# Patient Record
Sex: Male | Born: 1960
Health system: Southern US, Community
[De-identification: ages and names within clinical notes are randomized; demographics above are authoritative.]

## PROBLEM LIST (undated history)

## (undated) DIAGNOSIS — K5792 Diverticulitis of intestine, part unspecified, without perforation or abscess without bleeding: Secondary | ICD-10-CM

## (undated) DIAGNOSIS — I10 Essential (primary) hypertension: Secondary | ICD-10-CM

## (undated) DIAGNOSIS — Z860100 Personal history of colon polyps, unspecified: Secondary | ICD-10-CM

## (undated) DIAGNOSIS — K760 Fatty (change of) liver, not elsewhere classified: Secondary | ICD-10-CM

## (undated) DIAGNOSIS — T7840XA Allergy, unspecified, initial encounter: Secondary | ICD-10-CM

## (undated) DIAGNOSIS — G4730 Sleep apnea, unspecified: Secondary | ICD-10-CM

## (undated) DIAGNOSIS — E785 Hyperlipidemia, unspecified: Secondary | ICD-10-CM

## (undated) HISTORY — DX: Essential (primary) hypertension: I10

## (undated) HISTORY — DX: Hyperlipidemia, unspecified: E78.5

## (undated) HISTORY — DX: Allergy, unspecified, initial encounter: T78.40XA

## (undated) HISTORY — DX: Personal history of colonic polyps: Z86.010

## (undated) HISTORY — DX: Personal history of colon polyps, unspecified: Z86.0100

## (undated) HISTORY — DX: Sleep apnea, unspecified: G47.30

## (undated) HISTORY — DX: Diverticulitis of intestine, part unspecified, without perforation or abscess without bleeding: K57.92

## (undated) HISTORY — DX: Fatty (change of) liver, not elsewhere classified: K76.0

## (undated) HISTORY — PX: COLONOSCOPY: SHX174

---
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
## 1999-06-02 HISTORY — PX: CERVICAL DISCECTOMY: SHX98

## 2001-01-28 ENCOUNTER — Inpatient Hospital Stay (HOSPITAL_COMMUNITY): Admission: RE | Admit: 2001-01-28 | Discharge: 2001-01-28 | Payer: Self-pay | Admitting: Neurosurgery

## 2001-01-28 ENCOUNTER — Encounter: Payer: Self-pay | Admitting: Neurosurgery

## 2001-02-21 ENCOUNTER — Encounter: Admission: RE | Admit: 2001-02-21 | Discharge: 2001-02-21 | Payer: Self-pay | Admitting: Neurosurgery

## 2001-02-21 ENCOUNTER — Encounter: Payer: Self-pay | Admitting: Neurosurgery

## 2002-02-08 ENCOUNTER — Emergency Department (HOSPITAL_COMMUNITY): Admission: EM | Admit: 2002-02-08 | Discharge: 2002-02-08 | Payer: Self-pay | Admitting: *Deleted

## 2006-07-27 ENCOUNTER — Ambulatory Visit: Payer: Self-pay | Admitting: Internal Medicine

## 2006-08-13 ENCOUNTER — Ambulatory Visit: Payer: Self-pay | Admitting: Gastroenterology

## 2006-08-24 ENCOUNTER — Encounter: Payer: Self-pay | Admitting: Gastroenterology

## 2006-08-24 ENCOUNTER — Ambulatory Visit: Payer: Self-pay | Admitting: Gastroenterology

## 2006-08-24 LAB — HM COLONOSCOPY

## 2007-01-13 DIAGNOSIS — J302 Other seasonal allergic rhinitis: Secondary | ICD-10-CM | POA: Insufficient documentation

## 2007-01-13 DIAGNOSIS — Z860100 Personal history of colon polyps, unspecified: Secondary | ICD-10-CM | POA: Insufficient documentation

## 2007-01-13 DIAGNOSIS — K5730 Diverticulosis of large intestine without perforation or abscess without bleeding: Secondary | ICD-10-CM | POA: Insufficient documentation

## 2007-01-13 DIAGNOSIS — J301 Allergic rhinitis due to pollen: Secondary | ICD-10-CM | POA: Insufficient documentation

## 2007-03-02 ENCOUNTER — Ambulatory Visit: Payer: Self-pay | Admitting: Internal Medicine

## 2007-03-02 DIAGNOSIS — G479 Sleep disorder, unspecified: Secondary | ICD-10-CM | POA: Insufficient documentation

## 2007-03-03 LAB — CONVERTED CEMR LAB
Cholesterol: 230 mg/dL (ref 0–200)
Direct LDL: 165.7 mg/dL
Glucose, Bld: 108 mg/dL — ABNORMAL HIGH (ref 70–99)
HDL: 45.9 mg/dL (ref >39.0)
Total CHOL/HDL Ratio: 5
Triglycerides: 135 mg/dL (ref 0–149)
VLDL: 27 mg/dL (ref 0–40)

## 2007-03-09 ENCOUNTER — Ambulatory Visit: Payer: Self-pay | Admitting: Pulmonary Disease

## 2007-03-29 ENCOUNTER — Ambulatory Visit (HOSPITAL_BASED_OUTPATIENT_CLINIC_OR_DEPARTMENT_OTHER): Admission: RE | Admit: 2007-03-29 | Discharge: 2007-03-29 | Payer: Self-pay | Admitting: Pulmonary Disease

## 2007-03-29 ENCOUNTER — Encounter: Payer: Self-pay | Admitting: Pulmonary Disease

## 2007-04-12 ENCOUNTER — Ambulatory Visit: Payer: Self-pay | Admitting: Pulmonary Disease

## 2007-04-20 ENCOUNTER — Ambulatory Visit: Payer: Self-pay | Admitting: Pulmonary Disease

## 2007-05-20 ENCOUNTER — Ambulatory Visit: Payer: Self-pay | Admitting: Pulmonary Disease

## 2007-05-20 DIAGNOSIS — G4733 Obstructive sleep apnea (adult) (pediatric): Secondary | ICD-10-CM | POA: Insufficient documentation

## 2007-06-27 ENCOUNTER — Encounter: Payer: Self-pay | Admitting: Pulmonary Disease

## 2007-11-18 ENCOUNTER — Ambulatory Visit: Payer: Self-pay | Admitting: Pulmonary Disease

## 2007-11-22 ENCOUNTER — Encounter: Payer: Self-pay | Admitting: Internal Medicine

## 2007-11-25 ENCOUNTER — Encounter: Payer: Self-pay | Admitting: Internal Medicine

## 2010-07-01 NOTE — Assessment & Plan Note (Signed)
Summary: CPX/RBH   Vital Signs:  Patient Profile:   50 Years Old Male Height:     73.25 inches Weight:      280.13 pounds Temp:     98.6 degrees F oral Pulse rate:   72 / minute BP sitting:   140 / 88  (left arm) Cuff size:   large  Vitals Entered By: Wandra Mannan (March 02, 2007 8:15 AM)                 Chief Complaint:  cpx.  History of Present Illness: Feels tired otherwise okay has gained over 20# since last visit--eats the wrong things and eats late at night works 12 hours/day lately No exercise Doesn't eat breakfast, does eat lunch (brings or gets fast food) discussed snacks--he must be healthier    Current Allergies (reviewed today): No known allergies   Past Medical History:    Reviewed history from 01/13/2007 and no changes required:       Colonic polyps, hx of-adenomatous       Diverticulosis, colon       Allergic rhinitis--hay fever  Past Surgical History:    Reviewed history from 01/13/2007 and no changes required:       Appendectomy 1973       Pylonic stenosis,  infant       Cervical diskectomy 2001   Family History:    Father: Died at age 23, colon cancer    Mother: Alive    Siblings: One brother, 2 sisters-1 had  thyroid cancer    CAD on  Dad's side    HTN in [place] GF    DM on  Both sides      Social History:    Reviewed history from 01/13/2007 and no changes required:       Marital Status: Married       Children: 2 sons       Occupation: Haematologist. Solicitor, [employer], etc.       Former Smoker--quit 2/07       Alcohol use-rare    Review of Systems  The patient denies vision loss, decreased hearing, chest pain, syncope, dyspnea on exhertion, prolonged cough, abdominal pain, melena, hematochezia, muscle weakness, depression, abnormal bleeding, and enlarged lymph nodes.         Wears seat belt Not sleeping great--awakens after 2 hours often--discussed sleep hygiene No excessive daytime somnolence but does have  snoring and apnea and is tired Needs dental attention--needs some extractions Occ ankle edema Bowels are okay No urinary problems Occ mild sexual problems--??related to fatigue No sig arthritis has a couple of spots on skin he wants checked Seasonal allergy symptoms--allegra helps   Physical Exam  General:     alert and normal appearance.   Eyes:     pupils equal, pupils round, pupils reactive to light, and no optic disk abnormalities.   Ears:     R ear normal and L ear normal.   Mouth:     no lesions.   Neck:     supple, no masses, no thyromegaly, no carotid bruits, and no cervical lymphadenopathy.   Lungs:     normal respiratory effort and normal breath sounds.   Heart:     normal rate, regular rhythm, no murmur, and no gallop.   Abdomen:     soft, non-tender, no masses, no hepatomegaly, and no splenomegaly.   Msk:     no joint tenderness and no joint swelling.   Pulses:  normal in feet Extremities:     no edema Skin:     several seborrheic keratoses Axillary Nodes:     No palpable lymphadenopathy Psych:     normally interactive, good eye contact, not anxious appearing, and not depressed appearing.      Impression & Recommendations:  Problem # 1:  ROUTINE GENERAL MEDICAL EXAM, NON PEDIATRIC (ICD-V70.0) Assessment: Comment Only Discussed diet and fitness check sugar and lipid Orders: TLB-Glucose, QUANT ([id number])   Problem # 2:  SLEEP DISORDER (ICD-780.50) Assessment: New needs sleep eval Orders: Pulmonary Referral (Pulmonary)   Complete Medication List: 1)  Fexofenadine Hcl 180 Mg Tabs (Fexofenadine hcl) .Marland Kitchen.. 1 daily as needed for allergies  Other Orders: TLB-Lipid Panel ([id number]) Venipuncture ([id number])   Patient Instructions: 1)  Please schedule a follow-up appointment in 1 year. 2)  Referral Appointment Information 3)  Day/Date: 4)  Time: 5)  Place/MD: 6)  Address: 7)  Phone/Fax: 8)  Patient given appointment information.  Information/Orders faxed/mailed.     ] Prior Medications: Current Allergies (reviewed today): No known allergies

## 2010-10-14 NOTE — Procedures (Signed)
NAMEGUILLAUME, Anthony Bates              ACCOUNT NO.:  [id number]   MEDICAL RECORD NO.:  [id number]          PATIENT TYPE:  OUT   LOCATION:  SLEEP CENTER                 FACILITY[facility]  [facility]   PHYSICIAN:  Barbaraann Share, MD,FCCPDATE OF BIRTH:  May 24, 1961   DATE OF STUDY:  03/29/2007                            NOCTURNAL POLYSOMNOGRAM   REFERRING PHYSICIAN:  Barbaraann Share, MD,FCCP   INDICATION FOR STUDY:  Hypersomnia with sleep apnea   EPWORTH SLEEPINESS SCORE:  15   MEDICATIONS:   SLEEP ARCHITECTURE:  The patient had a total sleep time of 260 minutes  with no slow-wave sleep and significantly decreased REM.  Sleep onset  latency was normal and REM onset was normal, as well.  Sleep efficiency  was decreased at 72%.   RESPIRATORY DATA:  Patient was found to have 146 obstructive hypopneas,  45 obstructive apneas and 36 central apneas for an apnea/hypopnea index  of 52 events per hour.  The events occurred more frequently in the  supine position, as well as REM, and there was very loud snoring noted  throughout.   OXYGEN DATA:  There was O2 desaturation as low as 72% with the patient's  obstructive events.   CARDIAC DATA:  No clinically significant cardiac arrhythmias were noted.   MOVEMENT-PARASOMNIA:  None.   IMPRESSIONS-RECOMMENDATIONS:  Severe obstructive sleep apnea/hypopnea  syndrome with an apnea/hypopnea index of 52 events per hour and O2  desaturation as low as 72%.  Treatment for this degree of sleep apnea  should focus primarily on weight-loss, as well as CPAP.      Barbaraann Share, MD,FCCP  Diplomate, American Board of Sleep  Medicine  Electronically Signed     [facility]/MED[facility]  D:  04/12/2007 15:02:41  T:  04/13/2007 09:54:27  Job:  [id number]

## 2010-10-14 NOTE — Assessment & Plan Note (Signed)
[facility]                             PULMONARY OFFICE NOTE   RASHUN, GRATTAN                       MRN:          [id number]  DATE:04/20/2007                            DOB:          1960/10/28    REASON FOR VISIT:  Sleep medicine followup.   SUBJECTIVE:  Mr. Postema comes in today for followup of his recent  sleep study.  He was found to have severe obstructive sleep apnea with  an apnea/hypopnea index of 52 events per hour and oxygen desaturation as  low as 72%.  I have gone over his study in great detail with Mr.  Neisen and have answered all of his questions.   PHYSICAL EXAMINATION:  GENERAL APPEARANCE:  He is an overweight male in  no acute distress.  VITAL SIGNS:  Blood pressure is 144/100. Temperature is 97.7. Weight is  287 pounds.  Oxygen saturation is 95% on room air.   IMPRESSION:  Severe obstructive sleep apnea/hypopnea syndrome documented  by nocturnal polysomnography.  Given the severity of the patient's sleep  apnea, weight loss and CPAP are really his best options.  The patient is  agreeable to trying this.   PLAN:  1. Will initiate CPAP at 10 cm with a full face mask at this time.  He      will ultimately need pressure optimization.  2. I have asked the patient to try to get his blood pressure checked      over the next few days and if it continues to be elevated, he is to      contact Dr. Alphonsus Sias.  3. The patient will follow up in four weeks or sooner if there are      problems.     Barbaraann Share, MD,FCCP  Electronically Signed    [facility]Med[facility]  DD: 04/20/2007  DT: 04/21/2007  Job #: [id number]   cc:   Karie Schwalbe, MD

## 2010-10-14 NOTE — Assessment & Plan Note (Signed)
[facility]                             PULMONARY OFFICE NOTE   Anthony Bates, Anthony Bates                       MRN:          [id number]  DATE:03/09/2007                            DOB:          Oct 06, 1960    HISTORY OF PRESENT ILLNESS:  Patient is a 50 year old gentleman who I  have been asked to see for possible obstructive sleep apnea.  The  patient states that he has been having significant sleeping issues as  well as fatigue and sleep maintenance issues for quite some time.  Patient generally gets to bed between 10 and 12 at night and gets up at  6:30 a.m. to start his day.  He is very unrested when he arises.  He has  been noted to have loud snoring as well as pauses in his breathing  during sleep.  The patient typically takes 30 minutes to an hour to fall  asleep but when questioned very closely he admits that he possibly could  be having microsleep with frequent awakenings to the point that he  believes that he has never fallen asleep.  He also has at least 3  awakenings a night for unknown reasons.  Patient works as a Development worker, international aid and has significant sleep pressure during the day with periods  of inactivity especially in the afternoons, he will fall asleep very  easily with movies or TV in the evenings.  He does have some issues with  long distance driving unless it is after 9 p.m.  Of note, the patient's  weight is up about 50 pounds over the last 2 years.   PAST MEDICAL HISTORY:  1. Allergic rhinitis.  2. Chronic headaches.  3. Status post appendectomy.  4. History of spine surgery.   MEDICATIONS:  None.   PATIENT HAS NO KNOWN DRUG ALLERGIES.   SOCIAL HISTORY:  He is married and has children.  He has a history of  smoking 1 pack per day for 31 years, he has not smoked since 2007.   FAMILY HISTORY:  Remarkable for father having colon cancer, sister  having thyroid cancer.   REVIEW OF SYSTEMS:  As per history of present illness, also  see patient  intake form documented in the chart.   PHYSICAL EXAMINATION:  GENERAL:  He is an obese male in no acute  distress.  Blood pressure is 134/90, pulse 104, temperature is 98.4,  weight is 285 pounds, he is 6 foot 2 inches tall, O2 saturation on room  air is 95%.  HEENT:  Pupils equal, round, reactive to light and accommodation;  extraocular muscles are intact.  Nares show almost total obstruction of  his right nare and deviated septum to the left with narrowing,  oropharynx reveals elongation of the soft palata and uvula, tonsillar  hypertrophy.  NECK:  Supple without JVD or lymphadenopathy, there is no palpable  thyromegaly.  CHEST:  Totally clear.  CARDIAC:  Reveals regular rate and rhythm with no murmurs, rubs or  gallops.  ABDOMEN:  Soft, nontender with good bowel sounds.  Genital exam, rectal exam, breast exam was not  done and not indicated.  LOWER EXTREMITIES:  Without edema, good pulses distally, there is no  calf tenderness.  NEUROLOGICALLY:  Alert and oriented with no obvious motor deficits.   IMPRESSION:  Probable obstructive sleep apnea.  The patient has a very  good history for this, is overweight and has abnormal upper airway  anatomy.  I suspect most of his insomnia is secondary to sleep apnea  with microsleep.  Had a long conversation with him about sleep apnea and  have described for him the quality of life issues as well as the  cardiovascular side effects.  I think he needs a sleep study, and the  patient is agreeable to this.   PLAN:  1. Nocturnal polysomnogram.  2. Work on weight loss.  3. The patient will follow up after the above.     Barbaraann Share, MD,FCCP  Electronically Signed    [facility]Med[facility]  DD: 03/10/2007  DT: 03/11/2007  Job #: [id number]   cc:   Karie Schwalbe, MD

## 2010-10-17 NOTE — H&P (Signed)
[place]  Patient:    Anthony Bates, Anthony Bates Visit Number: [id number] MRN: [id number]          Service Type: SUR Location: [facility] 3172 07 Attending Physician:  Danella Penton Dictated by:   Tanya Nones. Jeral Fruit, M.D. Admit Date:  01/28/2001                           History and Physical  HISTORY OF PRESENT ILLNESS:  The gentleman is a patient who was seen by me about two weeks ago in my office because while he was at work on November 05, 2000, the patient felt a popping sensation in his neck.  From then off, he developed neck pain with radiation down to the left upper extremity and despite conservative treatment, he is not any better.  Basically, although he had been able to work, he was quite restricted.  He has some numbness of the left hand which is getting worse; it mostly involved the thumb and index finger.  He denies any problem with the right upper extremity.  PAST MEDICAL HISTORY:  Appendectomy in 1972.  SOCIAL HISTORY:  He smokes a pack a day.  He drinks socially.  FAMILY HISTORY:  Mother and sister followed with high blood pressure.  There is no history of diabetes in his family.  REVIEW OF SYSTEMS:  Negative except for neck pain.  PHYSICAL EXAMINATION:  HEENT:  Normal.  NECK:  Normal.  Although he is able to flex, extension produces pain that goes to the left shoulder.  LUNGS:  Clear.  HEART:  Heart sounds normal.  ABDOMEN:  Normal.  EXTREMITIES:  Normal pulses.  NEUROLOGIC:  Mental status normal.  Cranial nerves normal.  Strength is 5/5 except that I can break easily the left biceps and the left wrist extensor.  Sensation is normal to pinprick, light touch and vibration.  He complains of numbness which involves the thumb and index finger.  Reflexes are symmetrical with absence of the left biceps.  Coordination normal.  IMAGING STUDIES:  X-ray shows decrease of the height between 5-6.  MRI shows a herniated disk central  and to the left compromising the C6 nerve root.  CLINICAL IMPRESSION:  C5-6 herniated disk with a left C6 radiculopathy.  RECOMMENDATION:  The patient is being admitted for surgery.  He knows about the risks such as infection, CSF leak, worsening in the pain, damage to the vocal cords, damage to the carotids with a stroke as a result, failure of the bone graft and need for further surgery. Dictated by:   Tanya Nones. Jeral Fruit, M.D. Attending Physician:  Danella Penton DD:  01/28/01 TD:  01/28/01 Job: [id number]

## 2010-10-17 NOTE — Op Note (Signed)
[place]  Patient:    ERROL, ALA Visit Number: [id number] MRN: [id number]          Service Type: SUR Location: 3000 3041 01 Attending Physician:  Danella Penton Dictated by:   Tanya Nones. Jeral Fruit, M.D. Proc. Date: 01/28/01 Admit Date:  01/28/2001   CC:         Lubertha Basque. Jerl Santos, M.D.   Operative Report  PREOPERATIVE DIAGNOSIS:  C5-C6 herniated disk central and to the right.  POSTOPERATIVE DIAGNOSIS:  C5-C6 herniated disk central and to the right.  OPERATION:  Anterior C5-C6 diskectomy with decompression of the spinal cord and the nerve.  Interbody fusion without bone graft, plate, microscope.  SURGEON:  Tanya Nones. Jeral Fruit, M.D.  ASSISTANT:  Hewitt Shorts, M.D.  CLINICAL HISTORY:  The patient is a 50 year old gentleman complaining of neck and left lower extremity pain associated with weakness of the biceps.  X-rays show herniated disk at the level of 5-6 central and to the left.  Because of no improvement, the patient agreed with surgery.  The risks were explained in History & Physical.  DESCRIPTION OF PROCEDURE:  The patient was taken to the OR, and then the left side of the neck was prepped with Betadine.  Transverse incision through the skin and platysma was carried out all the way to the cervical spine.  X-ray showed that we were at the level of C5-6.  The anterior ligament was opened. We brought the microscope in, and we started doing a gross diskectomy.  We found that he has quite a bit of spondylosis.  We used the drill to remove the spondylosis centrally and laterally.  Then we opened the posterior ligament and, indeed, the there were six to seven pieces of fragment going to the left side.  Total removal of the ligament as well as total removal of free fragment was achieved.  In the left side, we found that the nerve root was swollen and reddish.  Decompression with bilateral foraminotomy was done.  Than an 8  mm bone graft was inserted between 5-6 followed by a plate.  Four screws were used.  At the end, we had good decompression, and the x-ray showed good positioning of the plate and the graft.  From then on, the area was investigated, and the carotid and ______ were normal.  Hemostasis was done by bipolar, and the wound was closed with Vicryl and Steri-Strips.  The patient did well. Dictated by:   Tanya Nones. Jeral Fruit, M.D. Attending Physician:  Danella Penton DD:  01/28/01 TD:  01/28/01 Job: [id number]

## 2010-11-08 ENCOUNTER — Encounter: Payer: Self-pay | Admitting: Internal Medicine

## 2010-11-10 ENCOUNTER — Ambulatory Visit (INDEPENDENT_AMBULATORY_CARE_PROVIDER_SITE_OTHER): Payer: BC Managed Care – PPO | Admitting: Internal Medicine

## 2010-11-10 ENCOUNTER — Encounter: Payer: Self-pay | Admitting: Internal Medicine

## 2010-11-10 DIAGNOSIS — L821 Other seborrheic keratosis: Secondary | ICD-10-CM | POA: Insufficient documentation

## 2010-11-10 NOTE — Assessment & Plan Note (Signed)
Clearly a seb keratosis but there is a distinct area on the side that could be an early cancer (though unlikely) Will plan to biopsy if it worsens

## 2010-11-10 NOTE — Patient Instructions (Signed)
Please call for appointment to do biopsy if that red area becomes more prominent

## 2010-11-10 NOTE — Progress Notes (Signed)
  Subjective:    Patient ID: Anthony Bates, male    DOB: Feb 23, 1961, 50 y.o.   MRN: [id number]  HPI Wife wanted him to get growth on right shoulder checked First noted about 3 weeks ago Was raised and looked a bit red No fever No pain  No known injury  Current Outpatient Prescriptions on File Prior to Visit  Medication Sig Dispense Refill  . DISCONTD: cetirizine (ZYRTEC) 10 MG tablet Take 10 mg by mouth daily as needed.        Marland Kitchen DISCONTD: naproxen sodium (ANAPROX) 220 MG tablet Take 220 mg by mouth daily as needed.         Past Medical History  Diagnosis Date  . Hx of colonic polyps      hx of-adenomatous  . Diverticulitis   . Allergy     Past Surgical History  Procedure Date  . Appendectomy   . Cervical discectomy 2001    Family History  Problem Relation Age of Onset  . Hypertension Paternal Grandfather     History   Social History  . Marital Status: Married    Spouse Name: N/A    Number of Children: 2  . Years of Education: N/A   Occupational History  . asst plant manager [employer]   Social History Main Topics  . Smoking status: Former Smoker    Quit date: 06/01/2005  . Smokeless tobacco: Not on file  . Alcohol Use: Yes     rare  . Drug Use: Not on file  . Sexually Active: Not on file   Other Topics Concern  . Not on file   Social History Narrative  . No narrative on file   Review of Systems No other rashes No fever     Objective:   Physical Exam  Constitutional: He appears well-developed and well-nourished. No distress.  Skin:       Seborrheic keratosis on right shoulder--along lateral border there is a 3mm area which appears somewhat distinct with firm border.           Assessment & Plan:

## 2011-02-12 ENCOUNTER — Encounter: Payer: Self-pay | Admitting: Internal Medicine

## 2011-02-12 ENCOUNTER — Ambulatory Visit (INDEPENDENT_AMBULATORY_CARE_PROVIDER_SITE_OTHER): Payer: BC Managed Care – PPO | Admitting: Internal Medicine

## 2011-02-12 DIAGNOSIS — Z23 Encounter for immunization: Secondary | ICD-10-CM

## 2011-02-12 DIAGNOSIS — Z0000 Encounter for general adult medical examination without abnormal findings: Secondary | ICD-10-CM | POA: Insufficient documentation

## 2011-02-12 DIAGNOSIS — G4733 Obstructive sleep apnea (adult) (pediatric): Secondary | ICD-10-CM

## 2011-02-12 DIAGNOSIS — R079 Chest pain, unspecified: Secondary | ICD-10-CM

## 2011-02-12 NOTE — Assessment & Plan Note (Signed)
Uses the CPAP with success

## 2011-02-12 NOTE — Assessment & Plan Note (Signed)
Healthy but out of shape BP mildly elevated---repeat on right 148/90 Discussed fitness Needs some relief at work Tdap given

## 2011-02-12 NOTE — Assessment & Plan Note (Signed)
Very nonspecific and brief EKG shows no ischemic changes ??criteria for LVH (not really in precordial leads). Discussed relationship with BP Will have him check periodically

## 2011-02-12 NOTE — Progress Notes (Signed)
Subjective:    Patient ID: Anthony Bates, male    DOB: [birth year], 50 y.o.   MRN: [id number]  HPI Here for physical No concerns Same job  Due for tetanus booster  He doesn't want flu  No regular exercise--counselled Works very long hours--12 hour days Hoping to give up some of the responsibility  Uses CPAP machine Sleeps well but often disturbed by phone (on call from work)  Doesn't check BP  No current outpatient prescriptions on file prior to visit.    No Known Allergies  Past Medical History  Diagnosis Date  . Hx of colonic polyps      hx of-adenomatous  . Diverticulitis   . Allergy     Past Surgical History  Procedure Date  . Appendectomy   . Cervical discectomy 2001    Family History  Problem Relation Age of Onset  . Hypertension Paternal Grandfather     History   Social History  . Marital Status: Married    Spouse Name: N/A    Number of Children: 2  . Years of Education: N/A   Occupational History  . asst plant manager [employer]   Social History Main Topics  . Smoking status: Former Smoker    Quit date: 06/01/2005  . Smokeless tobacco: Never Used  . Alcohol Use: Yes     rare  . Drug Use: Not on file  . Sexually Active: Not on file   Other Topics Concern  . Not on file   Social History Narrative  . No narrative on file   Review of Systems  Constitutional: Negative for fatigue and unexpected weight change.       Wears seat belt  HENT: Positive for congestion and rhinorrhea. Negative for hearing loss, dental problem and tinnitus.        Seasonal allergies--uses OTC  Had some teeth pulled earlier in the year  Eyes: Negative for visual disturbance.       No diplopia or unilateral vision loss  Respiratory: Positive for shortness of breath. Negative for cough and chest tightness.        DOE---seems to be stable   Cardiovascular: Positive for chest pain and leg swelling. Negative for palpitations.       Slight stabbing  sensation in chest---if moves the wrong way. Not with exertion. Lasts only 1-2 minutes Occ edema  Gastrointestinal: Negative for nausea, vomiting, abdominal pain, constipation and blood in stool.       Heartburn more regularly. TUMs help  Genitourinary: Negative for dysuria, urgency and difficulty urinating.       No sig sexual problems  Musculoskeletal: Positive for arthralgias. Negative for back pain and joint swelling.       Neck arthritis since discectomy. No meds or heat  Skin: Negative for rash.       No change in seb keratosis  Neurological: Positive for dizziness and headaches. Negative for syncope, weakness, light-headedness and numbness.       Slight orthostatic dizziness if he stands up too fast Stress headaches---evenings   Hematological: Negative for adenopathy. Does not bruise/bleed easily.  Psychiatric/Behavioral: Positive for dysphoric mood. Negative for sleep disturbance. The patient is not nervous/anxious.        Lots of stress occ depressed days but never more than 1 day       Objective:   Physical Exam  Constitutional: He is oriented to person, place, and time. He appears well-developed and well-nourished. No distress.  HENT:  Head: Normocephalic and atraumatic.  Right Ear: External ear normal.  Left Ear: External ear normal.  Mouth/Throat: Oropharynx is clear and moist. No oropharyngeal exudate.       TMs normal  Eyes: Conjunctivae and EOM are normal. Pupils are equal, round, and reactive to light.       Fundi benign  Neck: Normal range of motion. Neck supple. No JVD present. No tracheal deviation present. No thyromegaly present.  Cardiovascular: Normal rate, regular rhythm, normal heart sounds and intact distal pulses.  Exam reveals no gallop.   No murmur heard. Pulmonary/Chest: Effort normal and breath sounds normal. No respiratory distress. He has no wheezes. He has no rales.  Abdominal: Soft. He exhibits no mass. There is no tenderness.  Musculoskeletal:  Normal range of motion. He exhibits no edema and no tenderness.  Lymphadenopathy:    He has no cervical adenopathy.  Neurological: He is alert and oriented to person, place, and time. He exhibits normal muscle tone.       Normal strength and gait  Skin: No rash noted.       Scattered seb keratoses  Psychiatric: He has a normal mood and affect. His behavior is normal. Judgment and thought content normal.          Assessment & Plan:

## 2011-02-12 NOTE — Patient Instructions (Addendum)
If you need TUMS more than twice a week or so, please try ranitidine 150mg  once or twice a day, or famotidine 20mg  once or twice a day Please check your blood pressure every month or 2. Call for appt to reevaluate if over 145/90 regularly

## 2011-02-13 LAB — HEPATIC FUNCTION PANEL
ALT: 84 U/L — ABNORMAL HIGH (ref 0–53)
AST: 61 U/L — ABNORMAL HIGH (ref 0–37)
Albumin: 4.3 g/dL (ref 3.5–5.2)
Alkaline Phosphatase: 59 U/L (ref 39–117)
Bilirubin, Direct: 0.2 mg/dL (ref 0.0–0.3)
Total Bilirubin: 1 mg/dL (ref 0.3–1.2)
Total Protein: 7.7 g/dL (ref 6.0–8.3)

## 2011-02-13 LAB — BASIC METABOLIC PANEL
BUN: 14 mg/dL (ref 6–23)
CO2: 29 mEq/L (ref 19–32)
Calcium: 9.4 mg/dL (ref 8.4–10.5)
Chloride: 103 mEq/L (ref 96–112)
Creatinine, Ser: 1 mg/dL (ref 0.4–1.5)
GFR: 86.34 mL/min (ref >60.00)
Glucose, Bld: 100 mg/dL — ABNORMAL HIGH (ref 70–99)
Potassium: 4.1 mEq/L (ref 3.5–5.1)
Sodium: 140 mEq/L (ref 135–145)

## 2011-02-13 LAB — TSH: TSH: 3.35 u[IU]/mL (ref 0.35–5.50)

## 2011-02-13 LAB — LIPID PANEL
Cholesterol: 242 mg/dL — ABNORMAL HIGH (ref 0–200)
HDL: 46.4 mg/dL (ref >39.00)
Total CHOL/HDL Ratio: 5
Triglycerides: 225 mg/dL — ABNORMAL HIGH (ref 0.0–149.0)
VLDL: 45 mg/dL — ABNORMAL HIGH (ref 0.0–40.0)

## 2011-02-13 LAB — CBC WITH DIFFERENTIAL/PLATELET
Basophils Absolute: 0 10*3/uL (ref 0.0–0.1)
Basophils Relative: 0.5 % (ref 0.0–3.0)
Eosinophils Absolute: 0.1 10*3/uL (ref 0.0–0.7)
Eosinophils Relative: 1.3 % (ref 0.0–5.0)
HCT: 43.2 % (ref 39.0–52.0)
Hemoglobin: 14.5 g/dL (ref 13.0–17.0)
Lymphocytes Relative: 26.4 % (ref 12.0–46.0)
Lymphs Abs: 2.4 10*3/uL (ref 0.7–4.0)
MCHC: 33.6 g/dL (ref 30.0–36.0)
MCV: 93.8 fl (ref 78.0–100.0)
Monocytes Absolute: 0.6 10*3/uL (ref 0.1–1.0)
Monocytes Relative: 6.7 % (ref 3.0–12.0)
Neutro Abs: 5.8 10*3/uL (ref 1.4–7.7)
Neutrophils Relative %: 65.1 % (ref 43.0–77.0)
Platelets: 211 10*3/uL (ref 150.0–400.0)
RBC: 4.61 Mil/uL (ref 4.22–5.81)
RDW: 12.9 % (ref 11.5–14.6)
WBC: 8.9 10*3/uL (ref 4.5–10.5)

## 2011-02-13 LAB — LDL CHOLESTEROL, DIRECT: Direct LDL: 160.1 mg/dL

## 2011-02-13 LAB — PSA: PSA: 0.49 ng/mL (ref 0.10–4.00)

## 2011-02-19 ENCOUNTER — Telehealth: Payer: Self-pay | Admitting: *Deleted

## 2011-02-19 NOTE — Telephone Encounter (Signed)
Patient's home number is disconnected, cell number's voicemail not set up, let message on work voicemail to have him return my call.

## 2011-02-19 NOTE — Telephone Encounter (Signed)
Message copied by Sueanne Margarita on Thu Feb 19, 2011 11:07 AM ------      Message from: Tillman Abide I      Created: Sat Feb 14, 2011  2:55 PM       Please call      Blood work is okay but does have some concerns      The chol is still moderately elevated with total of 242 and LDL or bad chol of 160. We will need to watch this over time--and it should come down if he can get some time to live healthier      Liver tests are mildly elevated. This is nonspecific but I want to recheck this and not wait a year. Set up repeat hepatic and acute hepatitis profile in a month or so (to rule out a viral infection in liver)      Blood sugar is okay      Blood count, kidney, thyroid and prostate tests are normal

## 2011-02-27 ENCOUNTER — Encounter: Payer: Self-pay | Admitting: *Deleted

## 2011-02-27 NOTE — Telephone Encounter (Signed)
Still unable to reach patient, will mail letter to his home address.

## 2011-03-13 ENCOUNTER — Other Ambulatory Visit: Payer: BC Managed Care – PPO

## 2011-03-18 ENCOUNTER — Encounter: Payer: BC Managed Care – PPO | Admitting: Family Medicine

## 2011-03-23 ENCOUNTER — Other Ambulatory Visit (INDEPENDENT_AMBULATORY_CARE_PROVIDER_SITE_OTHER): Payer: BC Managed Care – PPO

## 2011-03-23 DIAGNOSIS — R7989 Other specified abnormal findings of blood chemistry: Secondary | ICD-10-CM

## 2011-03-23 LAB — HEPATIC FUNCTION PANEL
ALT: 53 U/L (ref 0–53)
AST: 33 U/L (ref 0–37)
Albumin: 4.3 g/dL (ref 3.5–5.2)
Alkaline Phosphatase: 56 U/L (ref 39–117)
Bilirubin, Direct: 0 mg/dL (ref 0.0–0.3)
Total Bilirubin: 0.4 mg/dL (ref 0.3–1.2)
Total Protein: 7.9 g/dL (ref 6.0–8.3)

## 2011-03-25 LAB — HEPATITIS PANEL, ACUTE
HCV Ab: NEGATIVE
Hep A IgM: NEGATIVE
Hep B C IgM: NEGATIVE
Hepatitis B Surface Ag: NEGATIVE

## 2011-07-07 ENCOUNTER — Encounter: Payer: Self-pay | Admitting: Gastroenterology

## 2011-12-31 DIAGNOSIS — K760 Fatty (change of) liver, not elsewhere classified: Secondary | ICD-10-CM

## 2011-12-31 HISTORY — DX: Fatty (change of) liver, not elsewhere classified: K76.0

## 2012-01-05 ENCOUNTER — Encounter: Payer: Self-pay | Admitting: *Deleted

## 2012-01-05 ENCOUNTER — Encounter: Payer: Self-pay | Admitting: Family Medicine

## 2012-01-05 ENCOUNTER — Ambulatory Visit (INDEPENDENT_AMBULATORY_CARE_PROVIDER_SITE_OTHER): Payer: BC Managed Care – PPO | Admitting: Family Medicine

## 2012-01-05 ENCOUNTER — Telehealth: Payer: Self-pay | Admitting: *Deleted

## 2012-01-05 ENCOUNTER — Ambulatory Visit
Admission: RE | Admit: 2012-01-05 | Discharge: 2012-01-05 | Disposition: A | Discharge status: Home / Self Care | Payer: BC Managed Care – PPO | Source: Ambulatory Visit | Attending: Family Medicine | Admitting: Family Medicine

## 2012-01-05 VITALS — BP 110/70 | HR 110 | Temp 100.7°F | Wt 299.2 lb

## 2012-01-05 DIAGNOSIS — R1110 Vomiting, unspecified: Secondary | ICD-10-CM

## 2012-01-05 DIAGNOSIS — R1011 Right upper quadrant pain: Secondary | ICD-10-CM

## 2012-01-05 LAB — LIPASE: Lipase: 24 U/L (ref 11.0–59.0)

## 2012-01-05 LAB — CBC WITH DIFFERENTIAL/PLATELET
Basophils Absolute: 0 10*3/uL (ref 0.0–0.1)
Basophils Relative: 0.3 % (ref 0.0–3.0)
Eosinophils Absolute: 0 10*3/uL (ref 0.0–0.7)
Eosinophils Relative: 0.1 % (ref 0.0–5.0)
HCT: 44.5 % (ref 39.0–52.0)
Hemoglobin: 15 g/dL (ref 13.0–17.0)
Lymphocytes Relative: 10.5 % — ABNORMAL LOW (ref 12.0–46.0)
Lymphs Abs: 1.5 10*3/uL (ref 0.7–4.0)
MCHC: 33.7 g/dL (ref 30.0–36.0)
MCV: 92.6 fl (ref 78.0–100.0)
Monocytes Absolute: 1.7 10*3/uL — ABNORMAL HIGH (ref 0.1–1.0)
Monocytes Relative: 11.3 % (ref 3.0–12.0)
Neutro Abs: 11.4 10*3/uL — ABNORMAL HIGH (ref 1.4–7.7)
Neutrophils Relative %: 77.8 % — ABNORMAL HIGH (ref 43.0–77.0)
Platelets: 245 10*3/uL (ref 150.0–400.0)
RBC: 4.8 Mil/uL (ref 4.22–5.81)
RDW: 13.7 % (ref 11.5–14.6)
WBC: 14.6 10*3/uL — ABNORMAL HIGH (ref 4.5–10.5)

## 2012-01-05 LAB — COMPREHENSIVE METABOLIC PANEL
ALT: 61 U/L — ABNORMAL HIGH (ref 0–53)
AST: 73 U/L — ABNORMAL HIGH (ref 0–37)
Albumin: 3.9 g/dL (ref 3.5–5.2)
Alkaline Phosphatase: 58 U/L (ref 39–117)
BUN: 17 mg/dL (ref 6–23)
CO2: 26 mEq/L (ref 19–32)
Calcium: 9.2 mg/dL (ref 8.4–10.5)
Chloride: 94 mEq/L — ABNORMAL LOW (ref 96–112)
Creatinine, Ser: 1.3 mg/dL (ref 0.4–1.5)
GFR: 62.47 mL/min (ref >60.00)
Glucose, Bld: 112 mg/dL — ABNORMAL HIGH (ref 70–99)
Potassium: 4.9 mEq/L (ref 3.5–5.1)
Sodium: 133 mEq/L — ABNORMAL LOW (ref 135–145)
Total Bilirubin: 0.7 mg/dL (ref 0.3–1.2)
Total Protein: 8.7 g/dL — ABNORMAL HIGH (ref 6.0–8.3)

## 2012-01-05 MED ORDER — ONDANSETRON 4 MG PO TBDP
4.0000 mg | ORAL_TABLET | Freq: Once | ORAL | Status: AC
  Administered 2012-01-05: 4 mg via ORAL

## 2012-01-05 MED ORDER — LEVOFLOXACIN 500 MG PO TABS: 500.0000 mg | ORAL_TABLET | Freq: Every day | ORAL | Status: AC

## 2012-01-05 MED ORDER — METRONIDAZOLE 500 MG PO TABS: 500.0000 mg | ORAL_TABLET | Freq: Three times a day (TID) | ORAL | Status: AC

## 2012-01-05 MED ORDER — PROMETHAZINE HCL 25 MG PO TABS: 25.0000 mg | ORAL_TABLET | Freq: Three times a day (TID) | ORAL | Status: DC | PRN

## 2012-01-05 NOTE — Telephone Encounter (Signed)
Spoke with pt.  Discussed overall normal abd [place] except for fatty liver.  Elevated white count, transaminitis, kidneys stable, normal lipase.   Will treat as cholecystitis despite normal [place] with abx course (flagyl and levoflox) and phenergan. Advised if any acute worsening to seek care at ER, if not improving with above treatment to call us , consider abd CT.

## 2012-01-05 NOTE — Addendum Note (Signed)
Addended by: Josph Macho A on: 01/05/2012 11:24 AM   Modules accepted: Orders

## 2012-01-05 NOTE — Progress Notes (Signed)
  Subjective:    Patient ID: Anthony Bates, male    DOB: January 22, 1961, 51 y.o.   MRN: [id number]  HPI CC: not feeling well  4d h/o fevers/chills, Tmax 104 Saturday afternoon, recently 101-102, sweating, diarrhea, vomiting.  Emesis NBNB.  Diarrhea clear liquid, watery.  Dizzy when standing.  HA worse with cough.  Cough started 3d ago.  Sinus drainage leads to cough.  Mild ST.  Having burning abd pain located to RUQ anytime he eats anything.  When not eating, no abd pain.  Able to keep fluids down.  No chest pain/tightness, SOB.  No ear or tooth pain.  No new rashes.  denies recent tick bites.  No sick contacts at home. No smokers at home.  Last colonoscopy 2008, told had diverticulosis but pt denies h/o diverticulitis.  H/o pyloric stenosis surgery as infant. S/p appendectomy 2012  Medications and allergies reviewed and updated in chart.  Past histories reviewed and updated if relevant as below. Patient Active Problem List  Diagnosis  . OBSTRUCTIVE SLEEP APNEA  . HAY FEVER  . ALLERGIC RHINITIS  . DIVERTICULOSIS, COLON  . COLONIC POLYPS, HX OF  . Seborrheic keratosis  . Routine general medical examination at a health care facility  . Chest pain, unspecified   Past Medical History  Diagnosis Date  . Hx of colonic polyps      hx of-adenomatous  . Diverticulitis     actually sounds like diverticulosis  . Allergy    Past Surgical History  Procedure Date  . Appendectomy   . Cervical discectomy 2001   History  Substance Use Topics  . Smoking status: Former Smoker    Quit date: 06/01/2005  . Smokeless tobacco: Never Used  . Alcohol Use: Yes     rare   Family History  Problem Relation Age of Onset  . Hypertension Paternal Grandfather    No Known Allergies No current outpatient prescriptions on file prior to visit.    Review of Systems Per HPI    Objective:   Physical Exam  Nursing note and vitals reviewed. Constitutional: He appears well-developed and  well-nourished. No distress.  HENT:  Mouth/Throat: Oropharynx is clear and moist. No oropharyngeal exudate.  Neck: Normal range of motion. Neck supple.  Cardiovascular: Regular rhythm, normal heart sounds and intact distal pulses.  Tachycardia present.   No murmur heard. Pulmonary/Chest: Effort normal and breath sounds normal. No respiratory distress. He has no wheezes. He has no rales.  Abdominal: Soft. Bowel sounds are normal. He exhibits no distension and no mass. There is tenderness in the right upper quadrant. There is positive Murphy's sign. There is no rigidity, no rebound and no guarding.  Musculoskeletal: He exhibits no edema.  Lymphadenopathy:    He has no cervical adenopathy.  Skin: Skin is warm and dry. No rash noted.  Psychiatric: He has a normal mood and affect.       Assessment & Plan:

## 2012-01-05 NOTE — Telephone Encounter (Signed)
Anthony Bates at [facility] called with [place] report: diffuse fatty infiltration of the liver. Patient has left and will await call with results. Results in chart.

## 2012-01-05 NOTE — Patient Instructions (Addendum)
I am worried about acute gall bladder inflammation/infection. We will get ultrasound and blood work today.   If positive, may recommend admission to hospital for treatment with IV fluids and antibiotics. zofran for nausea today.

## 2012-01-05 NOTE — Assessment & Plan Note (Signed)
With fever, nausea, emesis and diarrhea.   Given positive murphy and story, concern for acute cholecystitis. Rest of abd exam not acute abdomen. Stat blood work today as well as stat abd [place]. zofran for nausea today. Did discuss if any worsening needs to go to ER for evaluation.  May end up admitting anyways if + [place].

## 2012-01-07 NOTE — Telephone Encounter (Signed)
Spoke with patient, he stated that he still have occasional diarrhea, and fever.

## 2012-01-07 NOTE — Telephone Encounter (Signed)
Spoke with pt.  Feeling MUCH better.  Minimal diarrhea, no fevers anymore.  Tmax recently 100.  Given significant improvement, will not obtain abd CT scan. Advised to continue to monitor sxs for now, update [place] if worsening or not improving as expected. ?cholecystitis (although normal [place]), vs bacterial enteritis or colitis.

## 2012-01-07 NOTE — Telephone Encounter (Signed)
Can we call for update on how he's feeling, how diarrhea is doing, how is fever?

## 2012-04-12 ENCOUNTER — Encounter: Payer: Self-pay | Admitting: Gastroenterology

## 2013-03-28 ENCOUNTER — Encounter: Payer: Self-pay | Admitting: Gastroenterology

## 2013-05-16 ENCOUNTER — Ambulatory Visit (AMBULATORY_SURGERY_CENTER): Payer: Self-pay

## 2013-05-16 VITALS — Ht 73.5 in | Wt 312.6 lb

## 2013-05-16 DIAGNOSIS — Z800 Family history of malignant neoplasm of digestive organs: Secondary | ICD-10-CM

## 2013-05-16 DIAGNOSIS — Z86010 Personal history of colonic polyps: Secondary | ICD-10-CM

## 2013-05-16 MED ORDER — NA SULFATE-K SULFATE-MG SULF 17.5-3.13-1.6 GM/177ML PO SOLN: ORAL | Status: DC

## 2013-05-19 ENCOUNTER — Encounter: Payer: Self-pay | Admitting: Gastroenterology

## 2013-06-06 ENCOUNTER — Emergency Department (HOSPITAL_COMMUNITY)
Admission: EM | Admit: 2013-06-06 | Discharge: 2013-06-06 | Disposition: A | Discharge status: Home / Self Care | Payer: BC Managed Care – PPO | Attending: Emergency Medicine | Admitting: Emergency Medicine

## 2013-06-06 ENCOUNTER — Encounter: Payer: Self-pay | Admitting: Gastroenterology

## 2013-06-06 ENCOUNTER — Emergency Department (HOSPITAL_COMMUNITY): Payer: BC Managed Care – PPO

## 2013-06-06 ENCOUNTER — Other Ambulatory Visit: Payer: Self-pay

## 2013-06-06 ENCOUNTER — Encounter (HOSPITAL_COMMUNITY): Payer: Self-pay | Admitting: Emergency Medicine

## 2013-06-06 ENCOUNTER — Ambulatory Visit (AMBULATORY_SURGERY_CENTER): Payer: BC Managed Care – PPO | Admitting: Gastroenterology

## 2013-06-06 VITALS — BP 161/105 | HR 93 | Temp 97.7°F | Ht 73.0 in | Wt 312.0 lb

## 2013-06-06 DIAGNOSIS — R51 Headache: Secondary | ICD-10-CM | POA: Insufficient documentation

## 2013-06-06 DIAGNOSIS — G4730 Sleep apnea, unspecified: Secondary | ICD-10-CM | POA: Insufficient documentation

## 2013-06-06 DIAGNOSIS — I10 Essential (primary) hypertension: Secondary | ICD-10-CM | POA: Insufficient documentation

## 2013-06-06 DIAGNOSIS — Z87891 Personal history of nicotine dependence: Secondary | ICD-10-CM | POA: Insufficient documentation

## 2013-06-06 DIAGNOSIS — Z860100 Personal history of colon polyps, unspecified: Secondary | ICD-10-CM | POA: Insufficient documentation

## 2013-06-06 DIAGNOSIS — Z1211 Encounter for screening for malignant neoplasm of colon: Secondary | ICD-10-CM

## 2013-06-06 DIAGNOSIS — R519 Headache, unspecified: Secondary | ICD-10-CM

## 2013-06-06 DIAGNOSIS — Z8719 Personal history of other diseases of the digestive system: Secondary | ICD-10-CM | POA: Insufficient documentation

## 2013-06-06 LAB — URINALYSIS, ROUTINE W REFLEX MICROSCOPIC
Bilirubin Urine: NEGATIVE
Glucose, UA: NEGATIVE mg/dL
Hgb urine dipstick: NEGATIVE
Ketones, ur: NEGATIVE mg/dL
Leukocytes, UA: NEGATIVE
Nitrite: NEGATIVE
Protein, ur: NEGATIVE mg/dL
Specific Gravity, Urine: 1.016 (ref 1.005–1.030)
Urobilinogen, UA: 0.2 mg/dL (ref 0.0–1.0)
pH: 5 (ref 5.0–8.0)

## 2013-06-06 LAB — BASIC METABOLIC PANEL
BUN: 10 mg/dL (ref 6–23)
CO2: 24 mEq/L (ref 19–32)
Calcium: 9.5 mg/dL (ref 8.4–10.5)
Chloride: 98 mEq/L (ref 96–112)
Creatinine, Ser: 0.82 mg/dL (ref 0.50–1.35)
GFR calc Af Amer: >90 mL/min (ref >90)
GFR calc non Af Amer: >90 mL/min (ref >90)
Glucose, Bld: 113 mg/dL — ABNORMAL HIGH (ref 70–99)
Potassium: 4.1 mEq/L (ref 3.7–5.3)
Sodium: 138 mEq/L (ref 137–147)

## 2013-06-06 MED ORDER — ACETAMINOPHEN 325 MG PO TABS
650.0000 mg | ORAL_TABLET | Freq: Once | ORAL | Status: AC
  Administered 2013-06-06: 650 mg via ORAL
  Filled 2013-06-06: qty 2

## 2013-06-06 MED ORDER — SODIUM CHLORIDE 0.9 % IV SOLN: 500.0000 mL | INTRAVENOUS | Status: DC

## 2013-06-06 MED ORDER — HYDROCHLOROTHIAZIDE 25 MG PO TABS: 25.0000 mg | ORAL_TABLET | Freq: Every day | ORAL | Status: DC

## 2013-06-06 NOTE — ED Notes (Signed)
Patient transported to CT 

## 2013-06-06 NOTE — Assessment & Plan Note (Signed)
Plan followup colonoscopy as soon as patient stabilized.

## 2013-06-06 NOTE — Assessment & Plan Note (Signed)
Although asymptomatic the patient has severe hypertension.  It was felt that colonoscopy was not safe under these conditions we are, after discharge, pressure would return to the very elevated levels.  Accordingly, patient will be sent to the emergency room for immediate therapy.  Colonoscopy will be postponed for 24 hours provided that pressure gets under control.

## 2013-06-06 NOTE — ED Notes (Signed)
Pt was at [facility] for colonoscopy was sent over here due to high blood pressure, pt does not have hx of HTN. Pt has no symptoms(dizziness, headache, nausea).

## 2013-06-06 NOTE — Progress Notes (Signed)
Vital signs taken on admission  By Randye Lobo CMA, BP was high 161/105 L arm, rechecked RFA BP 227/147, let pt get undressed and lay down on bed and rest for a few minutes before taking BP manually, BP manually R arm was 166/120, laying down taken by Ritta Slot RN, advised Mont Dutton CRNA of pt BP, CRNA spoke with Dr. Deatra Ina and advised of BP, CRNA rechecked BP manually at 1020 R arm 182/118, L arm 161/112 taken while pt was laying down, pt denies chest pain, vision changes or headache while in admitting, CRNA reported BP reading to Dr. Deatra Ina, Dr. Deatra Ina came in and spoke to pt and advised that pt not have procedure due to elevated BP and advised pt and wife to go to ED to have BP checked out since it was in a dangerous range, Dr. Deatra Ina advised pt to stay on clear liquids, Jimmye Norman RN called and had colonoscopy set up at hospital tomorrow 06/07/13 so pt did not have to prep again.-adm

## 2013-06-06 NOTE — ED Notes (Signed)
MD at bedside. 

## 2013-06-06 NOTE — Progress Notes (Signed)
_                                                                                                                History of Present Illness: The patient presented to [facility]oscopy [facility] for recall colonoscopy.  A serrated adenoma was removed in 2008.  Family history is pertinent for father with colon cancer in his 71s.  The patient is feeling well and has no complaints, however, initial blood pressure was 227/147.  When taken lying down pressure was 166/120 in the right arm and 161/105 in the left arm.  Pulse 93.  Several minutes later blood pressure was repeated in the right and left arms.  There were 182/118 and 161/112, respectively.    Past Medical History  Diagnosis Date  . Hx of colonic polyps      hx of-adenomatous  . Diverticulitis     actually sounds like diverticulosis  . Allergy   . Fatty liver 12/2011    by [place]  . Sleep apnea     uses cpap   Past Surgical History  Procedure Laterality Date  . Appendectomy  1972  . Cervical discectomy  2001  . Pyloric stenosis  1960   family history includes Colon cancer in his father; Hypertension in his paternal grandfather. Current Outpatient Prescriptions  Medication Sig Dispense Refill  . promethazine (PHENERGAN) 25 MG tablet Take 1 tablet (25 mg total) by mouth every 8 (eight) hours as needed for nausea (sedation precautions).  30 tablet  0   Current Facility-Administered Medications  Medication Dose Route Frequency Provider Last Rate Last Dose  . 0.9 %  sodium chloride infusion  500 mL Intravenous Continuous Sharene Butters, MD       Allergies as of 06/06/2013  . (No Known Allergies)    reports that he quit smoking about 8 years ago. His smoking use included Cigarettes. He smoked 0.00 packs per day. He has quit using smokeless tobacco. He reports that he drinks alcohol. He reports that he does not use illicit drugs.     Review of Systems: Pertinent positive and negative review of systems were noted in the  above HPI section. All other review of systems were otherwise negative.  Vital signs were reviewed in today's medical record Physical Exam: General: Well developed , well nourished, no acute distress Skin: anicteric Head: Normocephalic and atraumatic Eyes:  sclerae anicteric, EOMI Ears: Normal auditory acuity Mouth: No deformity or lesions Neck: Supple, no masses or thyromegaly Lungs: Clear throughout to auscultation Heart: Regular rate and rhythm; no murmurs, rubs or bruits Abdomen: Soft, non tender and non distended. No masses, hepatosplenomegaly or hernias noted. Normal Bowel sounds Rectal:deferred Musculoskeletal: Symmetrical with no gross deformities  Skin: No lesions on visible extremities Pulses:  Normal pulses noted Extremities: No clubbing, cyanosis, edema or deformities noted Neurological: Alert oriented x 4, grossly nonfocal Cervical Nodes:  No significant cervical adenopathy Inguinal Nodes: No significant inguinal adenopathy Psychological:  Alert and cooperative. Normal mood and affect  See Assessment and  Plan under Problem List

## 2013-06-06 NOTE — ED Provider Notes (Signed)
CSN: [id number]     Arrival date & time 06/06/13  1110 History   First MD Initiated Contact with Patient 06/06/13 1234     Chief Complaint  Patient presents with  . Hypertension   (Consider location/radiation/quality/duration/timing/severity/associated sxs/prior Treatment) HPI Comments: 53 yo male presents with acutely elevated BP. Patient states his BP at his doctors office has been around 140/90, and has been told he is near needing antihypertensives by his PCP. He was set to get a colonoscopy, but when they took his BP it was supposedly over 200. He was asymptomatic, but he states they sent him to the ED because they were worried he would "stroke out". He has had intermittent headaches that are around 4-5/10 over past month. They are not daily and do not follow a pattern. Are frontal and he attributes them to "sinus problems". Denies chest pain, vision problems, dyspnea, weakness, numbness or recent infection. BP on arrival to ED is 809 systolic.    Past Medical History  Diagnosis Date  . Hx of colonic polyps      hx of-adenomatous  . Diverticulitis     actually sounds like diverticulosis  . Allergy   . Fatty liver 12/2011    by [place]  . Sleep apnea     uses cpap   Past Surgical History  Procedure Laterality Date  . Appendectomy  1972  . Cervical discectomy  2001  . Pyloric stenosis  1960   Family History  Problem Relation Age of Onset  . Hypertension Paternal Grandfather   . Colon cancer Father    History  Substance Use Topics  . Smoking status: Former Smoker    Types: Cigarettes    Quit date: 06/01/2005  . Smokeless tobacco: Former Systems developer  . Alcohol Use: Yes     Comment: rare    Review of Systems  Constitutional: Negative for fever and chills.  Eyes: Negative for photophobia and visual disturbance.  Respiratory: Negative for shortness of breath.   Cardiovascular: Negative for chest pain.  Gastrointestinal: Negative for nausea, vomiting and abdominal pain.   Genitourinary: Negative for dysuria and decreased urine volume.  Neurological: Positive for headaches. Negative for dizziness, seizures, syncope, weakness, light-headedness and numbness.  All other systems reviewed and are negative.    Allergies  Review of patient's allergies indicates no known allergies.  Home Medications   Current Outpatient Rx  Name  Route  Sig  Dispense  Refill  . ibuprofen (ADVIL,MOTRIN) 200 MG tablet   Oral   Take 400 mg by mouth every 6 (six) hours as needed for headache or moderate pain.         . hydrochlorothiazide (HYDRODIURIL) 25 MG tablet   Oral   Take 1 tablet (25 mg total) by mouth daily.   30 tablet   0    BP 152/79  Pulse 95  Temp(Src) 98 F (36.7 C) (Oral)  Resp 18  SpO2 98% Physical Exam  Nursing note and vitals reviewed. Constitutional: He is oriented to person, place, and time. He appears well-developed and well-nourished.  HENT:  Head: Normocephalic and atraumatic.  Right Ear: External ear normal.  Left Ear: External ear normal.  Nose: Nose normal.  Eyes: EOM are normal. Pupils are equal, round, and reactive to light. Right eye exhibits no discharge. Left eye exhibits no discharge.  Neck: Neck supple.  Cardiovascular: Normal rate, regular rhythm, normal heart sounds and intact distal pulses.   Pulmonary/Chest: Effort normal and breath sounds normal.  Abdominal: Soft. There  is no tenderness.  Musculoskeletal: He exhibits no edema.  Neurological: He is alert and oriented to person, place, and time. He has normal strength. No cranial nerve deficit or sensory deficit. GCS eye subscore is 4. GCS verbal subscore is 5. GCS motor subscore is 6.  CN 2-12 grossly intact. 5/5 strength in all 4 extremities  Skin: Skin is warm and dry.    ED Course  Procedures (including critical care time) Labs Review Labs Reviewed  BASIC METABOLIC PANEL - Abnormal; Notable for the following:    Glucose, Bld 113 (*)    All other components within  normal limits  URINALYSIS, ROUTINE W REFLEX MICROSCOPIC   Imaging Review Ct Head Wo Contrast  06/06/2013   CLINICAL DATA:  53 year old male bitemporal headache greater on the right side. Hypertension. Initial encounter.  EXAM: CT HEAD WITHOUT CONTRAST  TECHNIQUE: Contiguous axial images were obtained from the base of the skull through the vertex without intravenous contrast.  COMPARISON:  None.  FINDINGS: Visualized paranasal sinuses and mastoids are clear. No acute osseous abnormality identified. Visualized orbits and scalp soft tissues are within normal limits.  Mild Calcified atherosclerosis at the skull base. Cerebral volume is normal. No midline shift, ventriculomegaly, mass effect, evidence of mass lesion, intracranial hemorrhage or evidence of cortically based acute infarction. Gray-white matter differentiation is within normal limits throughout the brain. No suspicious intracranial vascular hyperdensity.  IMPRESSION: Normal noncontrast CT appearance of the brain.   Electronically Signed   By: Lars Pinks M.D.   On: 06/06/2013 14:04   Dg Chest Portable 1 View  06/06/2013   CLINICAL DATA:  Hypertension.  EXAM: PORTABLE CHEST - 1 VIEW  COMPARISON:  None.  FINDINGS: The heart size and mediastinal contours are within normal limits. Both lungs are clear. The visualized skeletal structures are unremarkable.  IMPRESSION: No active disease.   Electronically Signed   By: Aletta Edouard M.D.   On: 06/06/2013 15:21    EKG Interpretation   None       MDM   1. Hypertension   2. Headache    Patient is well appearing here, BP moderately elevated to 160 with some diastolic hypertension as well. No treatments given. Headache appears benign, not c/w SAH, meningitis/encephalitis, bleed or other acute pathology. Neuro exam normal. Given he has been borderline with BP and now elevated, will start on HCTZ and encourage close f/u with PCP. Given that he otherwise is well and no signs of end organ damage, will  discharge home. Of note, CXR was inadvertently performed by radiology on this patient when it was not ordered. It is normal but was not supposed to be ordered for this patient.     Ephraim Hamburger, MD 06/06/13 1755

## 2013-06-07 ENCOUNTER — Ambulatory Visit (HOSPITAL_COMMUNITY)
Admission: RE | Admit: 2013-06-07 | Discharge: 2013-06-07 | Disposition: A | Discharge status: Home / Self Care | Payer: BC Managed Care – PPO | Source: Ambulatory Visit | Attending: Gastroenterology | Admitting: Gastroenterology

## 2013-06-07 ENCOUNTER — Encounter (HOSPITAL_COMMUNITY): Payer: Self-pay

## 2013-06-07 ENCOUNTER — Encounter (HOSPITAL_COMMUNITY)
Admission: RE | Disposition: A | Discharge status: Home / Self Care | Payer: Self-pay | Source: Ambulatory Visit | Attending: Gastroenterology

## 2013-06-07 DIAGNOSIS — Z1211 Encounter for screening for malignant neoplasm of colon: Secondary | ICD-10-CM

## 2013-06-07 DIAGNOSIS — Z87891 Personal history of nicotine dependence: Secondary | ICD-10-CM | POA: Insufficient documentation

## 2013-06-07 DIAGNOSIS — G4730 Sleep apnea, unspecified: Secondary | ICD-10-CM | POA: Insufficient documentation

## 2013-06-07 DIAGNOSIS — K5730 Diverticulosis of large intestine without perforation or abscess without bleeding: Secondary | ICD-10-CM | POA: Insufficient documentation

## 2013-06-07 DIAGNOSIS — Z09 Encounter for follow-up examination after completed treatment for conditions other than malignant neoplasm: Secondary | ICD-10-CM | POA: Insufficient documentation

## 2013-06-07 DIAGNOSIS — Z800 Family history of malignant neoplasm of digestive organs: Secondary | ICD-10-CM | POA: Insufficient documentation

## 2013-06-07 DIAGNOSIS — Z860100 Personal history of colon polyps, unspecified: Secondary | ICD-10-CM | POA: Insufficient documentation

## 2013-06-07 HISTORY — PX: COLONOSCOPY: SHX5424

## 2013-06-07 SURGERY — COLONOSCOPY
Anesthesia: Moderate Sedation

## 2013-06-07 MED ORDER — MIDAZOLAM HCL 10 MG/2ML IJ SOLN
INTRAMUSCULAR | Status: AC
  Filled 2013-06-07: qty 2

## 2013-06-07 MED ORDER — DIPHENHYDRAMINE HCL 50 MG/ML IJ SOLN
INTRAMUSCULAR | Status: AC
  Filled 2013-06-07: qty 1

## 2013-06-07 MED ORDER — FENTANYL CITRATE 0.05 MG/ML IJ SOLN
INTRAMUSCULAR | Status: AC
  Filled 2013-06-07: qty 2

## 2013-06-07 MED ORDER — DIPHENHYDRAMINE HCL 50 MG/ML IJ SOLN
INTRAMUSCULAR | Status: DC | PRN
  Administered 2013-06-07 (×2): 25 mg via INTRAVENOUS

## 2013-06-07 MED ORDER — MIDAZOLAM HCL 10 MG/2ML IJ SOLN
INTRAMUSCULAR | Status: DC | PRN
  Administered 2013-06-07 (×2): 2 mg via INTRAVENOUS
  Administered 2013-06-07 (×2): 1 mg via INTRAVENOUS
  Administered 2013-06-07: 2 mg via INTRAVENOUS

## 2013-06-07 MED ORDER — SODIUM CHLORIDE 0.9 % IV SOLN
INTRAVENOUS | Status: DC
  Administered 2013-06-07: 12:00:00 via INTRAVENOUS

## 2013-06-07 MED ORDER — FENTANYL CITRATE 0.05 MG/ML IJ SOLN
INTRAMUSCULAR | Status: DC | PRN
  Administered 2013-06-07 (×5): 25 ug via INTRAVENOUS

## 2013-06-07 NOTE — H&P (Signed)
_                                                                                                                History of Present Illness: Recent S. family history: Cancer and history of serrated adenoma in 2008.  He is here for colonoscopy.    Past Medical History  Diagnosis Date  . Hx of colonic polyps      hx of-adenomatous  . Diverticulitis     actually sounds like diverticulosis  . Allergy   . Fatty liver 12/2011    by [place]  . Sleep apnea     uses cpap   Past Surgical History  Procedure Laterality Date  . Appendectomy  1972  . Cervical discectomy  2001  . Pyloric stenosis  1960   family history includes Colon cancer in his father; Hypertension in his paternal grandfather. Current Facility-Administered Medications  Medication Dose Route Frequency Provider Last Rate Last Dose  . 0.9 %  sodium chloride infusion   Intravenous Continuous Inda Castle, MD 20 mL/hr at 06/07/13 1201    . fentaNYL (SUBLIMAZE) injection    PRN Inda Castle, MD   25 mcg at 06/07/13 1243  . midazolam (VERSED) injection    PRN Inda Castle, MD   2 mg at 06/07/13 1244   Facility-Administered Medications Ordered in Other Encounters  Medication Dose Route Frequency Provider Last Rate Last Dose  . 0.9 %  sodium chloride infusion  500 mL Intravenous Continuous Sharene Butters, MD       Allergies as of 06/06/2013  . (No Known Allergies)    reports that he quit smoking about 8 years ago. His smoking use included Cigarettes. He smoked 0.00 packs per day. He has quit using smokeless tobacco. He reports that he drinks alcohol. He reports that he does not use illicit drugs.     Review of Systems: Pertinent positive and negative review of systems were noted in the above HPI section. All other review of systems were otherwise negative.  Vital signs were reviewed in today's medical record Physical Exam: General: Well developed , well nourished, no acute distress Skin:  anicteric Head: Normocephalic and atraumatic Eyes:  sclerae anicteric, EOMI Ears: Normal auditory acuity Mouth: No deformity or lesions Neck: Supple, no masses or thyromegaly Lungs: Clear throughout to auscultation Heart: Regular rate and rhythm; no murmurs, rubs or bruits Abdomen: Soft, non tender and non distended. No masses, hepatosplenomegaly or hernias noted. Normal Bowel sounds Rectal:deferred Musculoskeletal: Symmetrical with no gross deformities  Skin: No lesions on visible extremities Pulses:  Normal pulses noted Extremities: No clubbing, cyanosis, edema or deformities noted Neurological: Alert oriented x 4, grossly nonfocal Cervical Nodes:  No significant cervical adenopathy Inguinal Nodes: No significant inguinal adenopathy Psychological:  Alert and cooperative. Normal mood and affect  Impression #1 family history of colon cancer #2 personal history of colon polyps  Plan - colonoscopy

## 2013-06-07 NOTE — Discharge Instructions (Signed)
Colonoscopy  Care After  These instructions give you information on caring for yourself after your procedure. Your doctor may also give you more specific instructions. Call your doctor if you have any problems or questions after your procedure.  HOME CARE  · Take it easy for the next 24 hours.  · Rest.  · Walk or use warm packs on your belly (abdomen) if you have belly cramping or gas.  · Do not drive for 24 hours.  · You may shower.  · Do not sign important papers or use machinery for 24 hours.  · Drink enough fluids to keep your pee (urine) clear or pale yellow.  · Resume your normal diet. Avoid heavy or fried foods.  · Avoid alcohol.  · Continue taking your normal medicines.  · Only take medicine as told by your doctor. Do not take aspirin.  If you had growths (polyps) removed:  · Do not take aspirin.  · Do not drink alcohol for 7 days or as told by your doctor.  · Eat a soft diet for 24 hours.  GET HELP RIGHT AWAY IF:  · You have a fever.  · You pass clumps of tissue (blood clots) or fill the toilet with blood.  · You have belly pain that gets worse and medicine does not help.  · Your belly is puffy (swollen).  · You feel sick to your stomach (nauseous) or throw up (vomit).  MAKE SURE YOU:  · Understand these instructions.  · Will watch your condition.  · Will get help right away if you are not doing well or get worse.  Document Released: 06/20/2010 Document Revised: 08/10/2011 Document Reviewed: 06/20/2010  Exit[employer]® Patient Information ©2014 [employer].

## 2013-06-07 NOTE — Op Note (Signed)
[facility] [place], [id number]   COLONOSCOPY PROCEDURE REPORT  PATIENT: Anthony, Bates  MR#: [id number] BIRTHDATE: 05-Sep-1960 , 52  yrs. old GENDER: Male ENDOSCOPIST: Inda Castle, MD REFERRED OZ:[id number] Danise Mina, M.D. PROCEDURE DATE:  06/07/2013 PROCEDURE:   Colonoscopy, diagnostic First Screening Colonoscopy - Avg.  risk and is 50 yrs.  old or older - No.  Prior Negative Screening - Now for repeat screening. N/A  History of Adenoma - Now for follow-up colonoscopy & has been > or = to 3 yrs.  Yes hx of adenoma.  Has been 3 or more years since last colonoscopy.  Polyps Removed Today? No.  Recommend repeat exam, <10 yrs? Yes.  High risk (family or personal hx). ASA CLASS:   Class II INDICATIONS:Patient's personal history of colon polyps 2008, . MEDICATIONS: These medications were titrated to patient response per physician's verbal order, Fentanyl 125 mcg IV, Versed 8 mg IV, and Benadryl 25 mg IV  DESCRIPTION OF PROCEDURE:   After the risks benefits and alternatives of the procedure were thoroughly explained, informed consent was obtained.  A digital rectal exam revealed no abnormalities of the rectum.   The Pentax Adult Colonscope [id number] endoscope was introduced through the anus and advanced to the cecum, which was identified by both the appendix and ileocecal valve. No adverse events experienced.   Limited by poor preparation.   The quality of the prep was Suprep fair  The instrument was then slowly withdrawn as the colon was fully examined.      COLON FINDINGS: Moderate diverticulosis was noted in the sigmoid colon.   The colon was otherwise normal.  There was no diverticulosis, inflammation, polyps or cancers unless previously stated.  Retroflexed views revealed no abnormalities. The time to cecum=  .  Withdrawal time=13 minutes 0 seconds.  The scope was withdrawn and the procedure completed. COMPLICATIONS: There were no  complications.  ENDOSCOPIC IMPRESSION: 1.   Moderate diverticulosis was noted in the sigmoid colon 2.   The colon was otherwise normal  RECOMMENDATIONS: Due to limitations of exam secondary to retained, liquid stool recommend followup examination in 3 years  eSigned:  Inda Castle, MD 06/07/2013 1:13 PM   cc:   PATIENT NAME:  Anthony, Bates MR#: [id number]

## 2013-06-08 ENCOUNTER — Encounter (HOSPITAL_COMMUNITY): Payer: Self-pay | Admitting: Gastroenterology

## 2013-06-13 ENCOUNTER — Encounter: Payer: Self-pay | Admitting: Internal Medicine

## 2013-06-13 ENCOUNTER — Ambulatory Visit (INDEPENDENT_AMBULATORY_CARE_PROVIDER_SITE_OTHER): Payer: BC Managed Care – PPO | Admitting: Internal Medicine

## 2013-06-13 VITALS — BP 138/80 | HR 56 | Temp 97.6°F | Wt 310.0 lb

## 2013-06-13 DIAGNOSIS — I10 Essential (primary) hypertension: Secondary | ICD-10-CM

## 2013-06-13 MED ORDER — TRIAMTERENE-HCTZ 37.5-25 MG PO TABS: 1.0000 | ORAL_TABLET | Freq: Every day | ORAL | Status: DC

## 2013-06-13 NOTE — Patient Instructions (Signed)
Exercise to Lose Weight Exercise and a healthy diet may help you lose weight. Your doctor may suggest specific exercises. EXERCISE IDEAS AND TIPS  Choose low-cost things you enjoy doing, such as walking, bicycling, or exercising to workout videos.  Take stairs instead of the elevator.  Walk during your lunch break.  Park your car further away from work or school.  Go to a gym or an exercise class.  Start with 5 to 10 minutes of exercise each day. Build up to 30 minutes of exercise 4 to 6 days a week.  Wear shoes with good support and comfortable clothes.  Stretch before and after working out.  Work out until you breathe harder and your heart beats faster.  Drink extra water when you exercise.  Do not do so much that you hurt yourself, feel dizzy, or get very short of breath. Exercises that burn about 150 calories:  Running 1  miles in 15 minutes.  Playing volleyball for 45 to 60 minutes.  Washing and waxing a car for 45 to 60 minutes.  Playing touch football for 45 minutes.  Walking 1  miles in 35 minutes.  Pushing a stroller 1  miles in 30 minutes.  Playing basketball for 30 minutes.  Raking leaves for 30 minutes.  Bicycling 5 miles in 30 minutes.  Walking 2 miles in 30 minutes.  Dancing for 30 minutes.  Shoveling snow for 15 minutes.  Swimming laps for 20 minutes.  Walking up stairs for 15 minutes.  Bicycling 4 miles in 15 minutes.  Gardening for 30 to 45 minutes.  Jumping rope for 15 minutes.  Washing windows or floors for 45 to 60 minutes. Document Released: 06/20/2010 Document Revised: 08/10/2011 Document Reviewed: 06/20/2010 [facility] Patient Information 2014 [place], [place]. DASH Diet The DASH diet stands for "Dietary Approaches to Stop Hypertension." It is a healthy eating plan that has been shown to reduce high blood pressure (hypertension) in as little as 14 days, while also possibly providing other significant health benefits. These other  health benefits include reducing the risk of breast cancer after menopause and reducing the risk of type 2 diabetes, heart disease, colon cancer, and stroke. Health benefits also include weight loss and slowing kidney failure in patients with chronic kidney disease.  DIET GUIDELINES  Limit salt (sodium). Your diet should contain less than 1500 mg of sodium daily.  Limit refined or processed carbohydrates. Your diet should include mostly whole grains. Desserts and added sugars should be used sparingly.  Include small amounts of heart-healthy fats. These types of fats include nuts, oils, and tub margarine. Limit saturated and trans fats. These fats have been shown to be harmful in the body. CHOOSING FOODS  The following food groups are based on a 2000 calorie diet. See your Registered Dietitian for individual calorie needs. Grains and Grain Products (6 to 8 servings daily)  Eat More Often: Whole-wheat bread, brown rice, whole-grain or wheat pasta, quinoa, popcorn without added fat or salt (air popped).  Eat Less Often: White bread, white pasta, white rice, cornbread. Vegetables (4 to 5 servings daily)  Eat More Often: Fresh, frozen, and canned vegetables. Vegetables may be raw, steamed, roasted, or grilled with a minimal amount of fat.  Eat Less Often/Avoid: Creamed or fried vegetables. Vegetables in a cheese sauce. Fruit (4 to 5 servings daily)  Eat More Often: All fresh, canned (in natural juice), or frozen fruits. Dried fruits without added sugar. One hundred percent fruit juice ( cup [237 mL] daily).  Eat Less Often: Dried fruits with added sugar. Canned fruit in light or heavy syrup. [employer], Fish, and Poultry (2 servings or less daily. One serving is 3 to 4 oz [85-114 g]).  Eat More Often: Ninety percent or leaner ground beef, tenderloin, sirloin. Round cuts of beef, chicken breast, [place] breast. All fish. Grill, bake, or broil your meat. Nothing should be fried.  Eat Less  Often/Avoid: Fatty cuts of meat, [place], or chicken leg, thigh, or wing. Fried cuts of meat or fish. Dairy (2 to 3 servings)  Eat More Often: Low-fat or fat-free milk, low-fat plain or light yogurt, reduced-fat or part-skim cheese.  Eat Less Often/Avoid: Milk (whole, 2%).Whole milk yogurt. Full-fat cheeses. Nuts, Seeds, and Legumes (4 to 5 servings per week)  Eat More Often: All without added salt.  Eat Less Often/Avoid: Salted nuts and seeds, canned beans with added salt. Fats and Sweets (limited)  Eat More Often: Vegetable oils, tub margarines without trans fats, sugar-free gelatin. Mayonnaise and salad dressings.  Eat Less Often/Avoid: Coconut oils, palm oils, butter, stick margarine, cream, half and half, cookies, candy, pie. FOR MORE INFORMATION The Dash Diet Eating Plan: www.dashdiet.org Document Released: 05/07/2011 Document Revised: 08/10/2011 Document Reviewed: 05/07/2011 [facility] Patient Information 2014 [place], [place].

## 2013-06-13 NOTE — Progress Notes (Signed)
Pre-visit discussion using our clinic review tool. No additional management support is needed unless otherwise documented below in the visit note.  

## 2013-06-13 NOTE — Assessment & Plan Note (Signed)
BP Readings from Last 3 Encounters:  06/13/13 138/80  06/07/13 133/96  06/07/13 133/96   Repeat 130/88 on right Will change to dyazide Discussed lifestyle issues

## 2013-06-13 NOTE — Progress Notes (Signed)
   Subjective:    Patient ID: Anthony Bates, male    DOB: 26-Oct-1960, 53 y.o.   MRN: [id number]  HPI Reviewed ER visit and the colonoscopy Just started HCTZ Has noted some joint aches goes back a while but worse in past few days  Some headaches--squeeezing sensation in temples Started about a month ago No chest pain No SOB Some edema--stable but more frequent (depends on how long he is up)  Current Outpatient Prescriptions on File Prior to Visit  Medication Sig Dispense Refill  . hydrochlorothiazide (HYDRODIURIL) 25 MG tablet Take 1 tablet (25 mg total) by mouth daily.  30 tablet  0  . ibuprofen (ADVIL,MOTRIN) 200 MG tablet Take 400 mg by mouth every 6 (six) hours as needed for headache or moderate pain.       No current facility-administered medications on file prior to visit.    No Known Allergies  Past Medical History  Diagnosis Date  . Hx of colonic polyps      hx of-adenomatous  . Diverticulitis     actually sounds like diverticulosis  . Allergy   . Fatty liver 12/2011    by [place]  . Sleep apnea     uses cpap    Past Surgical History  Procedure Laterality Date  . Appendectomy  1972  . Cervical discectomy  2001  . Pyloric stenosis  1960  . Colonoscopy N/A 06/07/2013    Procedure: COLONOSCOPY;  Surgeon: Inda Castle, MD;  Location: [facility];  Service: Endoscopy;  Laterality: N/A;    Family History  Problem Relation Age of Onset  . Hypertension Paternal Grandfather   . Colon cancer Father     History   Social History  . Marital Status: Married    Spouse Name: N/A    Number of Children: 2  . Years of Education: N/A   Occupational History  . 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 Smoking status: Former Smoker    Types: Cigarettes    Quit date: 06/01/2005  . Smokeless tobacco: Former Systems developer  . Alcohol Use: Yes     Comment: rare  . Drug Use: No  . Sexual Activity: Not on file   Other Topics Concern  . Not on file    Social History Narrative  . No narrative on file   Review of Systems Sleeps okay--- uses CPAP with success Works nights-- 12 hours per day No exercise     Objective:   Physical Exam  Constitutional: He appears well-developed and well-nourished. No distress.  Neck: Normal range of motion. Neck supple. No thyromegaly present.  Cardiovascular: Normal rate, regular rhythm and normal heart sounds.  Exam reveals no gallop.   No murmur heard. Pulmonary/Chest: Effort normal and breath sounds normal. No respiratory distress. He has no wheezes. He has no rales.  Musculoskeletal:  Thick legs without pitting  Lymphadenopathy:    He has no cervical adenopathy.  Psychiatric: He has a normal mood and affect. His behavior is normal.          Assessment & Plan:

## 2013-07-05 ENCOUNTER — Telehealth: Payer: Self-pay | Admitting: Internal Medicine

## 2013-07-05 NOTE — Telephone Encounter (Signed)
Relevant patient education mailed to patient.  

## 2013-07-25 ENCOUNTER — Encounter: Payer: Self-pay | Admitting: Internal Medicine

## 2013-07-25 ENCOUNTER — Ambulatory Visit (INDEPENDENT_AMBULATORY_CARE_PROVIDER_SITE_OTHER): Payer: BC Managed Care – PPO | Admitting: Internal Medicine

## 2013-07-25 VITALS — BP 140/90 | HR 84 | Temp 98.4°F | Wt 308.0 lb

## 2013-07-25 DIAGNOSIS — I10 Essential (primary) hypertension: Secondary | ICD-10-CM

## 2013-07-25 LAB — BASIC METABOLIC PANEL
BUN: 13 mg/dL (ref 6–23)
CO2: 30 mEq/L (ref 19–32)
Calcium: 9.5 mg/dL (ref 8.4–10.5)
Chloride: 100 mEq/L (ref 96–112)
Creatinine, Ser: 1 mg/dL (ref 0.4–1.5)
GFR: 80.72 mL/min (ref >60.00)
Glucose, Bld: 102 mg/dL — ABNORMAL HIGH (ref 70–99)
Potassium: 3.8 mEq/L (ref 3.5–5.1)
Sodium: 138 mEq/L (ref 135–145)

## 2013-07-25 NOTE — Progress Notes (Signed)
Pre visit review using our clinic review tool, if applicable. No additional management support is needed unless otherwise documented below in the visit note. 

## 2013-07-25 NOTE — Progress Notes (Signed)
   Subjective:    Patient ID: Anthony Bates, male    DOB: 03-Nov-1960, 53 y.o.   MRN: [id number]  HPI Feels better Headaches are gone No dizziness or syncope No chest pain No SOB  Works 6 days per week Limited time for exercise  Current Outpatient Prescriptions on File Prior to Visit  Medication Sig Dispense Refill  . ibuprofen (ADVIL,MOTRIN) 200 MG tablet Take 400 mg by mouth every 6 (six) hours as needed for headache or moderate pain.      Marland Kitchen triamterene-hydrochlorothiazide (MAXZIDE-25) 37.5-25 MG per tablet Take 1 tablet by mouth daily.  90 tablet  3   No current facility-administered medications on file prior to visit.    No Known Allergies  Past Medical History  Diagnosis Date  . Hx of colonic polyps      hx of-adenomatous  . Diverticulitis     actually sounds like diverticulosis  . Allergy   . Fatty liver 12/2011    by [place]  . Sleep apnea     uses cpap  . Hypertension     Past Surgical History  Procedure Laterality Date  . Appendectomy  1972  . Cervical discectomy  2001  . Pyloric stenosis  1960  . Colonoscopy N/A 06/07/2013    Procedure: COLONOSCOPY;  Surgeon: Inda Castle, MD;  Location: [facility];  Service: Endoscopy;  Laterality: N/A;    Family History  Problem Relation Age of Onset  . Hypertension Paternal Grandfather   . Colon cancer Father     History   Social History  . Marital Status: Married    Spouse Name: N/A    Number of Children: 2  . Years of Education: N/A   Occupational History  . 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 Smoking status: Former Smoker    Types: Cigarettes    Quit date: 06/01/2005  . Smokeless tobacco: Former Systems developer  . Alcohol Use: Yes     Comment: rare  . Drug Use: No  . Sexual Activity: Not on file   Other Topics Concern  . Not on file   Social History Narrative  . No narrative on file   Review of Systems Weight is down 2#--hard to keep healthy working 12-14 hours per  day Sleeps okay    Objective:   Physical Exam  Constitutional: He appears well-developed and well-nourished. No distress.  Neck: Normal range of motion. Neck supple. No thyromegaly present.  Cardiovascular: Normal rate, regular rhythm and normal heart sounds.  Exam reveals no gallop.   No murmur heard. Pulmonary/Chest: Effort normal and breath sounds normal. No respiratory distress. He has no wheezes. He has no rales.  Musculoskeletal: He exhibits no edema and no tenderness.  Lymphadenopathy:    He has no cervical adenopathy.  Psychiatric: He has a normal mood and affect. His behavior is normal.          Assessment & Plan:

## 2013-07-25 NOTE — Patient Instructions (Signed)
DASH Diet  The DASH diet stands for "Dietary Approaches to Stop Hypertension." It is a healthy eating plan that has been shown to reduce high blood pressure (hypertension) in as little as 14 days, while also possibly providing other significant health benefits. These other health benefits include reducing the risk of breast cancer after menopause and reducing the risk of type 2 diabetes, heart disease, colon cancer, and stroke. Health benefits also include weight loss and slowing kidney failure in patients with chronic kidney disease.   DIET GUIDELINES  · Limit salt (sodium). Your diet should contain less than 1500 mg of sodium daily.  · Limit refined or processed carbohydrates. Your diet should include mostly whole grains. Desserts and added sugars should be used sparingly.  · Include small amounts of heart-healthy fats. These types of fats include nuts, oils, and tub margarine. Limit saturated and trans fats. These fats have been shown to be harmful in the body.  CHOOSING FOODS   The following food groups are based on a 2000 calorie diet. See your Registered Dietitian for individual calorie needs.  Grains and Grain Products (6 to 8 servings daily)  · Eat More Often: Whole-wheat bread, brown rice, whole-grain or wheat pasta, quinoa, popcorn without added fat or salt (air popped).  · Eat Less Often: White bread, white pasta, white rice, cornbread.  Vegetables (4 to 5 servings daily)  · Eat More Often: Fresh, frozen, and canned vegetables. Vegetables may be raw, steamed, roasted, or grilled with a minimal amount of fat.  · Eat Less Often/Avoid: Creamed or fried vegetables. Vegetables in a cheese sauce.  Fruit (4 to 5 servings daily)  · Eat More Often: All fresh, canned (in natural juice), or frozen fruits. Dried fruits without added sugar. One hundred percent fruit juice (½ cup [237 mL] daily).  · Eat Less Often: Dried fruits with added sugar. Canned fruit in light or heavy syrup.  Lean Meats, Fish, and Poultry (2  servings or less daily. One serving is 3 to 4 oz [85-114 g]).  · Eat More Often: Ninety percent or leaner ground beef, tenderloin, sirloin. Round cuts of beef, chicken breast, turkey breast. All fish. Grill, bake, or broil your meat. Nothing should be fried.  · Eat Less Often/Avoid: Fatty cuts of meat, turkey, or chicken leg, thigh, or wing. Fried cuts of meat or fish.  Dairy (2 to 3 servings)  · Eat More Often: Low-fat or fat-free milk, low-fat plain or light yogurt, reduced-fat or part-skim cheese.  · Eat Less Often/Avoid: Milk (whole, 2%). Whole milk yogurt. Full-fat cheeses.  Nuts, Seeds, and Legumes (4 to 5 servings per week)  · Eat More Often: All without added salt.  · Eat Less Often/Avoid: Salted nuts and seeds, canned beans with added salt.  Fats and Sweets (limited)  · Eat More Often: Vegetable oils, tub margarines without trans fats, sugar-free gelatin. Mayonnaise and salad dressings.  · Eat Less Often/Avoid: Coconut oils, palm oils, butter, stick margarine, cream, half and half, cookies, candy, pie.  FOR MORE INFORMATION  The Dash Diet Eating Plan: www.dashdiet.org  Document Released: 05/07/2011 Document Revised: 08/10/2011 Document Reviewed: 05/07/2011  [employer]® Patient Information ©2014 [employer].

## 2013-07-25 NOTE — Assessment & Plan Note (Signed)
BP Readings from Last 3 Encounters:  07/25/13 140/90  06/13/13 138/80  06/07/13 133/96   Acceptable control Discussed lifestyle Check renal

## 2013-07-26 ENCOUNTER — Telehealth: Payer: Self-pay | Admitting: Internal Medicine

## 2013-07-26 NOTE — Telephone Encounter (Signed)
Relevant patient education assigned to patient using Emmi. ° °

## 2014-02-09 ENCOUNTER — Encounter: Payer: Self-pay | Admitting: Internal Medicine

## 2014-02-09 ENCOUNTER — Ambulatory Visit (INDEPENDENT_AMBULATORY_CARE_PROVIDER_SITE_OTHER): Payer: BC Managed Care – PPO | Admitting: Internal Medicine

## 2014-02-09 VITALS — BP 132/80 | HR 76 | Temp 98.1°F | Ht 73.0 in | Wt 310.0 lb

## 2014-02-09 DIAGNOSIS — Z125 Encounter for screening for malignant neoplasm of prostate: Secondary | ICD-10-CM

## 2014-02-09 DIAGNOSIS — I10 Essential (primary) hypertension: Secondary | ICD-10-CM

## 2014-02-09 DIAGNOSIS — Z0000 Encounter for general adult medical examination without abnormal findings: Secondary | ICD-10-CM

## 2014-02-09 DIAGNOSIS — E785 Hyperlipidemia, unspecified: Secondary | ICD-10-CM | POA: Insufficient documentation

## 2014-02-09 LAB — COMPREHENSIVE METABOLIC PANEL
ALT: 49 U/L (ref 0–53)
AST: 39 U/L — ABNORMAL HIGH (ref 0–37)
Albumin: 4 g/dL (ref 3.5–5.2)
Alkaline Phosphatase: 51 U/L (ref 39–117)
BUN: 15 mg/dL (ref 6–23)
CO2: 28 mEq/L (ref 19–32)
Calcium: 8.9 mg/dL (ref 8.4–10.5)
Chloride: 101 mEq/L (ref 96–112)
Creatinine, Ser: 1.1 mg/dL (ref 0.4–1.5)
GFR: 77.06 mL/min (ref >60.00)
Glucose, Bld: 107 mg/dL — ABNORMAL HIGH (ref 70–99)
Potassium: 4.2 mEq/L (ref 3.5–5.1)
Sodium: 137 mEq/L (ref 135–145)
Total Bilirubin: 0.6 mg/dL (ref 0.2–1.2)
Total Protein: 7.6 g/dL (ref 6.0–8.3)

## 2014-02-09 LAB — CBC WITH DIFFERENTIAL/PLATELET
Basophils Absolute: 0 10*3/uL (ref 0.0–0.1)
Basophils Relative: 0.5 % (ref 0.0–3.0)
Eosinophils Absolute: 0.2 10*3/uL (ref 0.0–0.7)
Eosinophils Relative: 1.9 % (ref 0.0–5.0)
HCT: 43 % (ref 39.0–52.0)
Hemoglobin: 14.6 g/dL (ref 13.0–17.0)
Lymphocytes Relative: 33.1 % (ref 12.0–46.0)
Lymphs Abs: 3 10*3/uL (ref 0.7–4.0)
MCHC: 33.9 g/dL (ref 30.0–36.0)
MCV: 92.6 fl (ref 78.0–100.0)
Monocytes Absolute: 1 10*3/uL (ref 0.1–1.0)
Monocytes Relative: 11.2 % (ref 3.0–12.0)
Neutro Abs: 4.8 10*3/uL (ref 1.4–7.7)
Neutrophils Relative %: 53.3 % (ref 43.0–77.0)
Platelets: 221 10*3/uL (ref 150.0–400.0)
RBC: 4.65 Mil/uL (ref 4.22–5.81)
RDW: 13.5 % (ref 11.5–15.5)
WBC: 8.9 10*3/uL (ref 4.0–10.5)

## 2014-02-09 LAB — LIPID PANEL
Cholesterol: 186 mg/dL (ref 0–200)
HDL: 36.2 mg/dL — ABNORMAL LOW (ref >39.00)
LDL Cholesterol: 114 mg/dL — ABNORMAL HIGH (ref 0–99)
NonHDL: 149.8
Total CHOL/HDL Ratio: 5
Triglycerides: 179 mg/dL — ABNORMAL HIGH (ref 0.0–149.0)
VLDL: 35.8 mg/dL (ref 0.0–40.0)

## 2014-02-09 LAB — T4, FREE: Free T4: 1.03 ng/dL (ref 0.60–1.60)

## 2014-02-09 LAB — PSA: PSA: 0.5 ng/mL (ref 0.10–4.00)

## 2014-02-09 NOTE — Assessment & Plan Note (Signed)
Generally healthy but needs to work on fitness Doesn't want flu shot Will check PSA after discussion

## 2014-02-09 NOTE — Assessment & Plan Note (Signed)
Discussed primary prevention If still has LDL over 160, will start atorvastatin 20

## 2014-02-09 NOTE — Progress Notes (Signed)
Subjective:    Patient ID: Anthony Bates, male    DOB: [birth year], 53 y.o.   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 Likes the 3rd shift  Tries to eat healthy Brings lunch--usually leftovers No exercise still--- walks a lot at work [employer] on this  Doing well on BP med No new concerns  Current Outpatient Prescriptions on File Prior to Visit  Medication Sig Dispense Refill  . ibuprofen (ADVIL,MOTRIN) 200 MG tablet Take 400 mg by mouth every 6 (six) hours as needed for headache or moderate pain.      Marland Kitchen triamterene-hydrochlorothiazide (MAXZIDE-25) 37.5-25 MG per tablet Take 1 tablet by mouth daily.  90 tablet  3   No current facility-administered medications on file prior to visit.    No Known Allergies  Past Medical History  Diagnosis Date  . Hx of colonic polyps      hx of-adenomatous  . Diverticulitis     actually sounds like diverticulosis  . Allergy   . Fatty liver 12/2011    by [place]  . Sleep apnea     uses cpap  . Hypertension     Past Surgical History  Procedure Laterality Date  . Appendectomy  1972  . Cervical discectomy  2001  . Pyloric stenosis  1960  . Colonoscopy N/A 06/07/2013    Procedure: COLONOSCOPY;  Surgeon: Inda Castle, MD;  Location: [facility];  Service: Endoscopy;  Laterality: N/A;    Family History  Problem Relation Age of Onset  . Hypertension Paternal Grandfather   . Colon cancer Father     History   Social History  . Marital Status: Married    Spouse Name: N/A    Number of Children: 2  . Years of Education: N/A   Occupational History  . Third shift maintenance manager [employer]   Social History Main Topics  . Smoking status: Former Smoker    Types: Cigarettes    Quit date: 06/01/2005  . Smokeless tobacco: Former Systems developer  . Alcohol Use: Yes     Comment: rare  . Drug Use: No  . Sexual Activity: Not on file   Other Topics Concern  . Not on file    Social History Narrative  . No narrative on file   Review of Systems  Constitutional: Negative for fatigue and unexpected weight change.       Wears seat belt  HENT: Negative for dental problem, hearing loss, tinnitus and trouble swallowing.        Regular with dentist  Eyes: Negative for visual disturbance.       No diplopia or unilateral vision loss  Respiratory: Negative for cough, chest tightness and shortness of breath.   Cardiovascular: Negative for chest pain, palpitations and leg swelling.  Gastrointestinal: Negative for nausea, vomiting, abdominal pain, constipation and blood in stool.       Heartburn is intermittent-- uses rolaids  Endocrine: Positive for polydipsia and polyuria.  Genitourinary: Negative for urgency, frequency and difficulty urinating.       No sexual problems  Musculoskeletal: Negative for arthralgias, back pain and joint swelling.  Skin: Negative for rash.       No suspicious lesions  Allergic/Immunologic: Positive for environmental allergies. Negative for immunocompromised state.       Uses cetirizine with success  Neurological: Negative for dizziness, syncope, weakness, light-headedness, numbness and headaches.  Hematological: Negative for adenopathy. Does not bruise/bleed easily.  Psychiatric/Behavioral: Positive for  sleep disturbance.       Still adjusting to 3rd shift CPAP still fine       Objective:   Physical Exam  Constitutional: He is oriented to person, place, and time. He appears well-developed and well-nourished. No distress.  HENT:  Head: Normocephalic and atraumatic.  Right Ear: External ear normal.  Left Ear: External ear normal.  Mouth/Throat: Oropharynx is clear and moist. No oropharyngeal exudate.  Eyes: Conjunctivae and EOM are normal. Pupils are equal, round, and reactive to light.  Neck: Normal range of motion. Neck supple. No thyromegaly present.  Cardiovascular: Normal rate, regular rhythm, normal heart sounds and intact  distal pulses.  Exam reveals no gallop.   No murmur heard. Pulmonary/Chest: Effort normal and breath sounds normal. No respiratory distress. He has no wheezes. He has no rales.  Abdominal: Soft. He exhibits no distension. There is no tenderness. There is no rebound and no guarding.  Musculoskeletal: He exhibits no edema and no tenderness.  Lymphadenopathy:    He has no cervical adenopathy.  Neurological: He is alert and oriented to person, place, and time.  Skin: No rash noted. No erythema.  Psychiatric: He has a normal mood and affect. His behavior is normal.          Assessment & Plan:

## 2014-02-09 NOTE — Assessment & Plan Note (Signed)
BP Readings from Last 3 Encounters:  02/09/14 132/80  07/25/13 140/90  06/13/13 138/80   Good control

## 2014-02-09 NOTE — Progress Notes (Signed)
Pre visit review using our clinic review tool, if applicable. No additional management support is needed unless otherwise documented below in the visit note. 

## 2014-07-17 ENCOUNTER — Encounter: Payer: Self-pay | Admitting: Internal Medicine

## 2014-07-17 MED ORDER — TRIAMTERENE-HCTZ 37.5-25 MG PO TABS: 1.0000 | ORAL_TABLET | Freq: Every day | ORAL | Status: DC

## 2014-11-13 IMAGING — CT CT HEAD W/O CM
2 of 4 series · 16 of 30 positions shown, 19 images · non-contrast
Comparison: None.

CLINICAL DATA: 52-year-old male bitemporal headache greater on the
right side. Hypertension. Initial encounter.

EXAM:
CT HEAD WITHOUT CONTRAST
TECHNIQUE: Contiguous axial images were obtained from the base of the skull
through the vertex without intravenous contrast.

[Series 2: head w/o · axial · non-contrast · 0.48mm/px · z∈[-127,-7]mm · 12 of 30 slices shown, 15 images]
[im 3/30  brain]
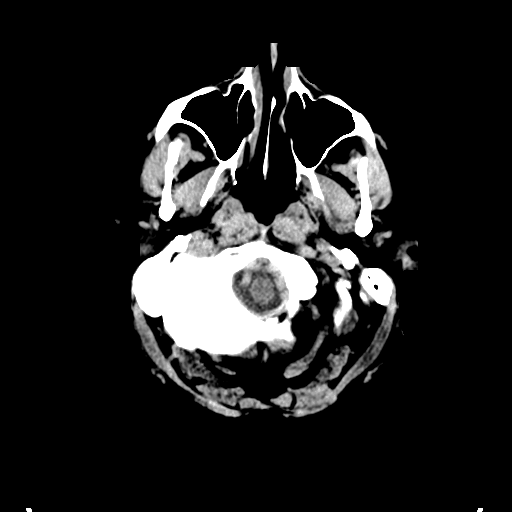
[im 3/30  bone]
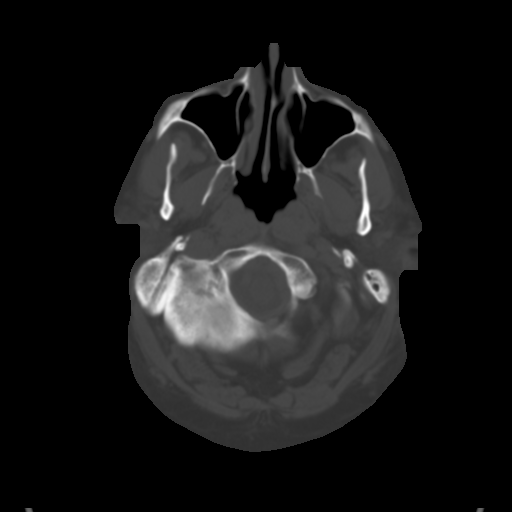
[im 5/30  brain]
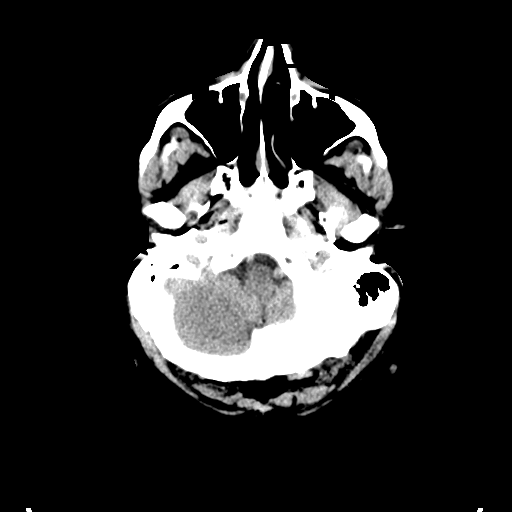
[im 7/30  brain]
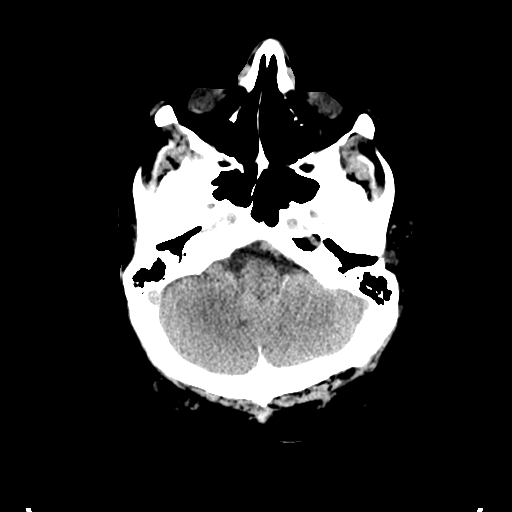
[im 9/30  brain]
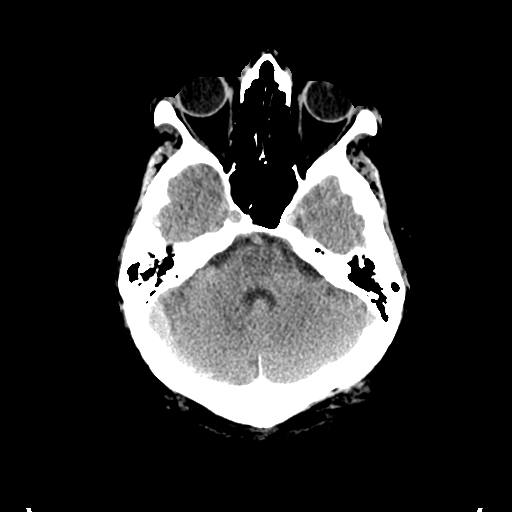
[im 12/30  brain]
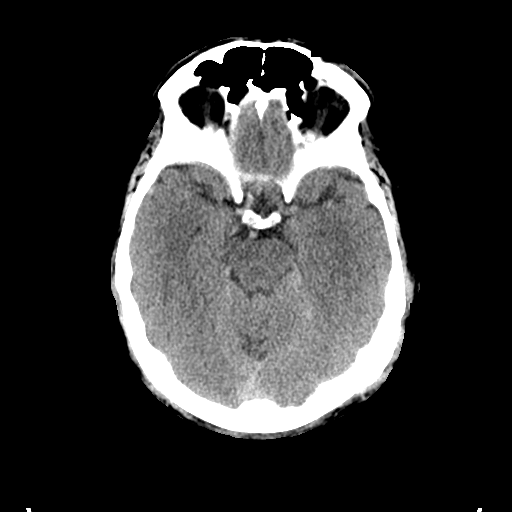
[im 12/30  bone]
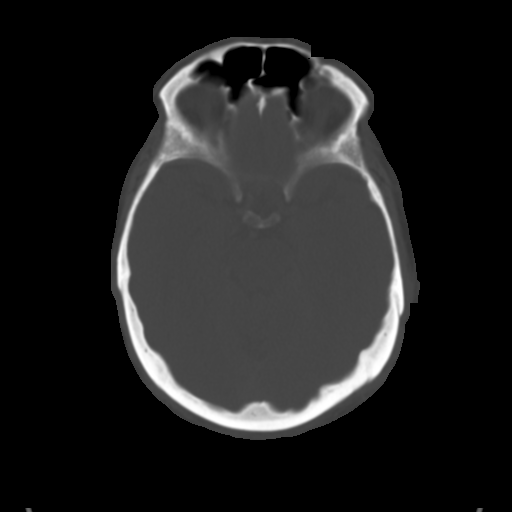
[im 14/30  brain]
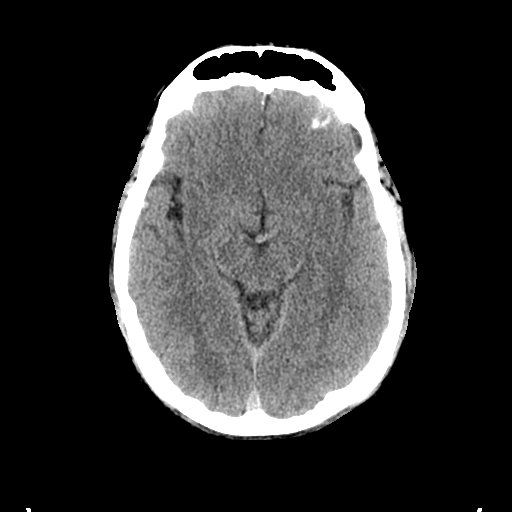
[im 16/30  brain]
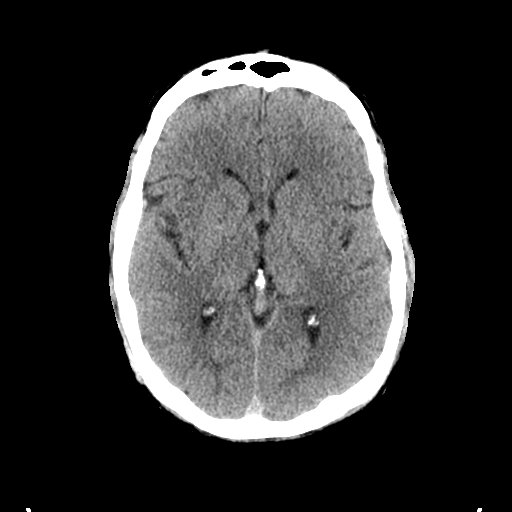
[im 18/30  brain]
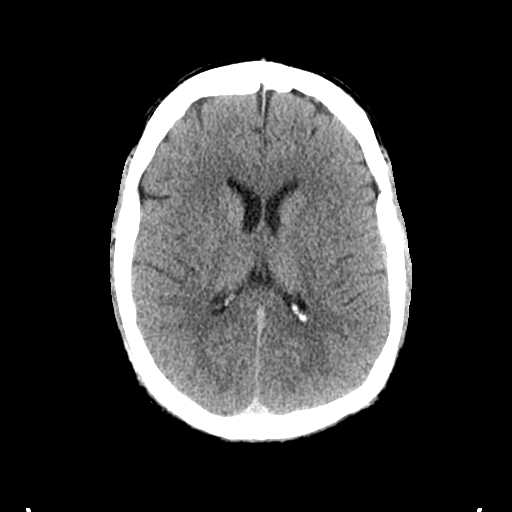
[im 21/30  brain]
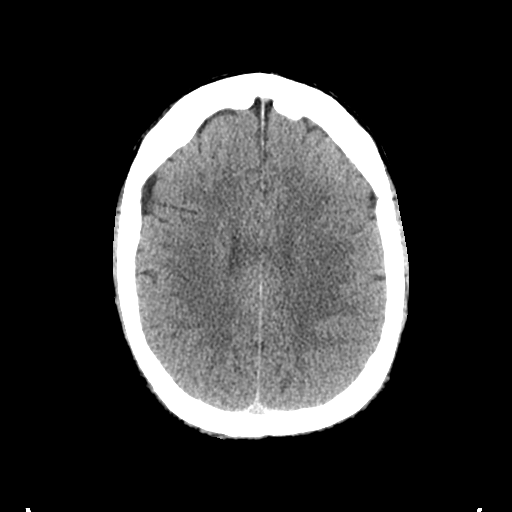
[im 21/30  bone]
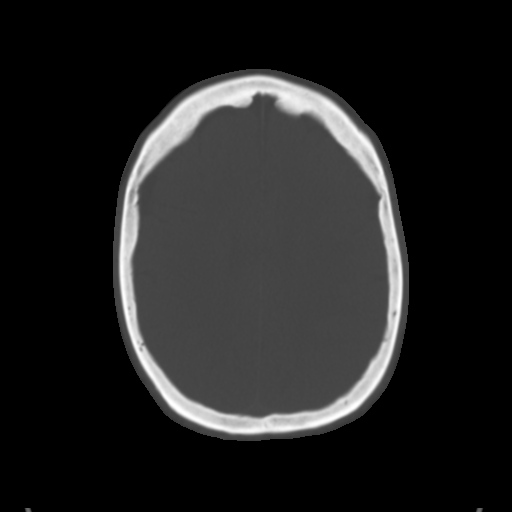
[im 23/30  brain]
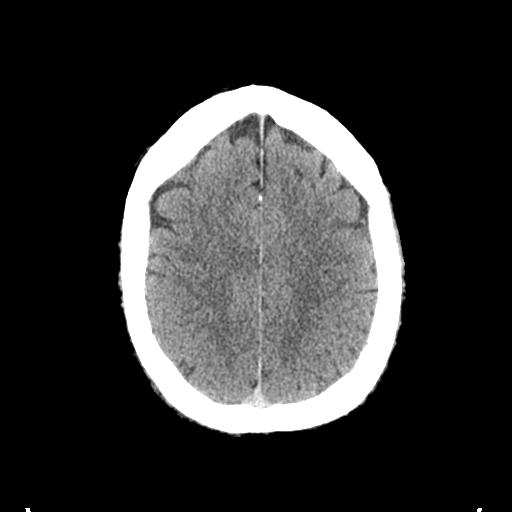
[im 25/30  brain]
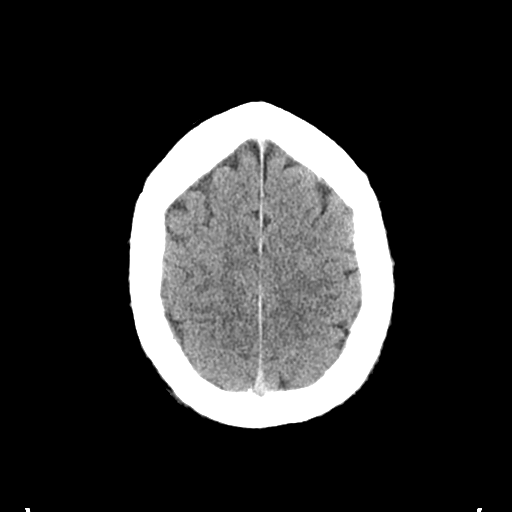
[im 27/30  brain]
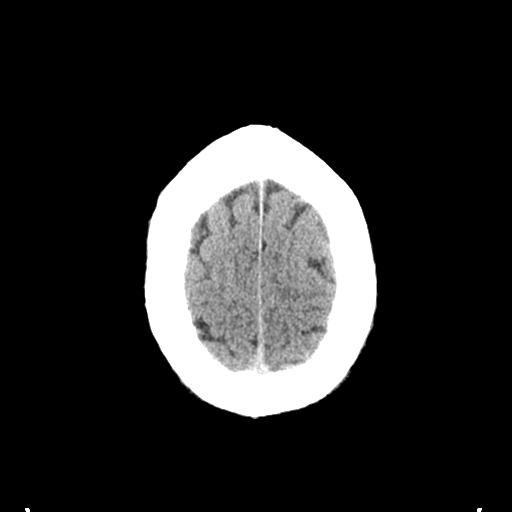

[Series 3: bone windows · axial · 0.48mm/px · z∈[-127,-72]mm · 4 of 30 slices shown]
[im 3/30  bone]
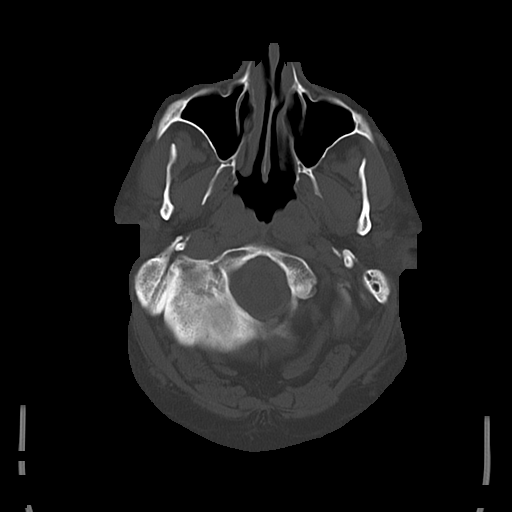
[im 7/30  bone]
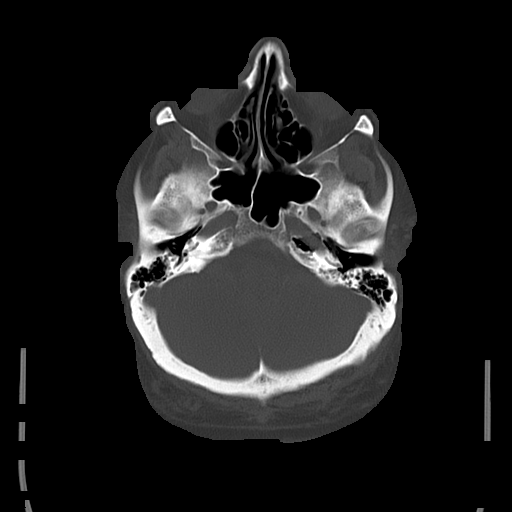
[im 9/30  bone]
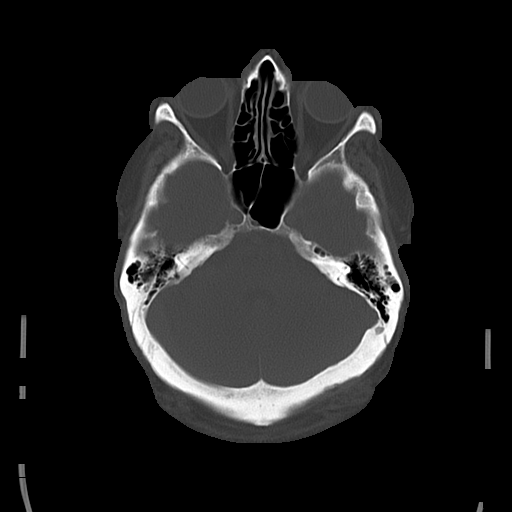
[im 14/30  bone]
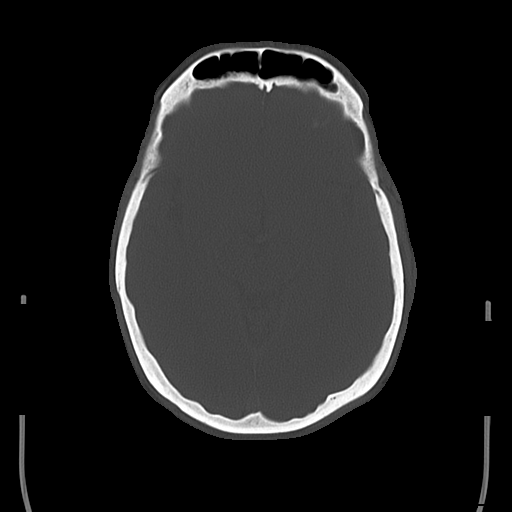

[16 of 30 positions shown; findings below may reference images not displayed]

FINDINGS: Visualized paranasal sinuses and mastoids are clear. No acute
osseous abnormality identified. Visualized orbits and scalp soft
tissues are within normal limits.

Mild Calcified atherosclerosis at the skull base. Cerebral volume is
normal. No midline shift, ventriculomegaly, mass effect, evidence of
mass lesion, intracranial hemorrhage or evidence of cortically based
acute infarction. Gray-white matter differentiation is within normal
limits throughout the brain. No suspicious intracranial vascular
hyperdensity.
IMPRESSION: Normal noncontrast CT appearance of the brain.

## 2015-02-19 ENCOUNTER — Encounter: Payer: BC Managed Care – PPO | Admitting: Internal Medicine

## 2015-03-29 ENCOUNTER — Ambulatory Visit (INDEPENDENT_AMBULATORY_CARE_PROVIDER_SITE_OTHER): Payer: BLUE CROSS/BLUE SHIELD | Admitting: Internal Medicine

## 2015-03-29 ENCOUNTER — Encounter: Payer: Self-pay | Admitting: Internal Medicine

## 2015-03-29 VITALS — BP 128/82 | HR 73 | Temp 97.7°F | Ht 73.0 in | Wt 275.0 lb

## 2015-03-29 DIAGNOSIS — Z0000 Encounter for general adult medical examination without abnormal findings: Secondary | ICD-10-CM

## 2015-03-29 DIAGNOSIS — I10 Essential (primary) hypertension: Secondary | ICD-10-CM

## 2015-03-29 LAB — COMPREHENSIVE METABOLIC PANEL
ALT: 28 U/L (ref 0–53)
AST: 24 U/L (ref 0–37)
Albumin: 4.2 g/dL (ref 3.5–5.2)
Alkaline Phosphatase: 49 U/L (ref 39–117)
BUN: 15 mg/dL (ref 6–23)
CO2: 31 mEq/L (ref 19–32)
Calcium: 9.5 mg/dL (ref 8.4–10.5)
Chloride: 98 mEq/L (ref 96–112)
Creatinine, Ser: 1 mg/dL (ref 0.40–1.50)
GFR: 82.08 mL/min (ref >60.00)
Glucose, Bld: 114 mg/dL — ABNORMAL HIGH (ref 70–99)
Potassium: 3.8 mEq/L (ref 3.5–5.1)
Sodium: 138 mEq/L (ref 135–145)
Total Bilirubin: 0.5 mg/dL (ref 0.2–1.2)
Total Protein: 8.2 g/dL (ref 6.0–8.3)

## 2015-03-29 LAB — CBC WITH DIFFERENTIAL/PLATELET
Basophils Absolute: 0 10*3/uL (ref 0.0–0.1)
Basophils Relative: 0.3 % (ref 0.0–3.0)
Eosinophils Absolute: 0.2 10*3/uL (ref 0.0–0.7)
Eosinophils Relative: 2.1 % (ref 0.0–5.0)
HCT: 44.5 % (ref 39.0–52.0)
Hemoglobin: 14.7 g/dL (ref 13.0–17.0)
Lymphocytes Relative: 31.7 % (ref 12.0–46.0)
Lymphs Abs: 2.8 10*3/uL (ref 0.7–4.0)
MCHC: 33 g/dL (ref 30.0–36.0)
MCV: 91.9 fl (ref 78.0–100.0)
Monocytes Absolute: 1 10*3/uL (ref 0.1–1.0)
Monocytes Relative: 11.7 % (ref 3.0–12.0)
Neutro Abs: 4.7 10*3/uL (ref 1.4–7.7)
Neutrophils Relative %: 54.2 % (ref 43.0–77.0)
Platelets: 223 10*3/uL (ref 150.0–400.0)
RBC: 4.84 Mil/uL (ref 4.22–5.81)
RDW: 13.3 % (ref 11.5–15.5)
WBC: 8.7 10*3/uL (ref 4.0–10.5)

## 2015-03-29 NOTE — Assessment & Plan Note (Signed)
Great strides in fitness Prefers no flu vaccine UTD on colon Will defer PSA this year

## 2015-03-29 NOTE — Progress Notes (Signed)
Subjective:    Patient ID: Anthony Bates, male    DOB: Aug 21, 1960, 54 y.o.   MRN: [id number]  HPI Here for physical  Has kept up with healthy eating Physically active at work--no exercise Has lost 35#   No new concerns  Current Outpatient Prescriptions on File Prior to Visit  Medication Sig Dispense Refill  . ibuprofen (ADVIL,MOTRIN) 200 MG tablet Take 400 mg by mouth every 6 (six) hours as needed for headache or moderate pain.    Marland Kitchen triamterene-hydrochlorothiazide (MAXZIDE-25) 37.5-25 MG per tablet Take 1 tablet by mouth daily. 90 tablet 3   No current facility-administered medications on file prior to visit.    No Known Allergies  Past Medical History  Diagnosis Date  . Hx of colonic polyps      hx of-adenomatous  . Diverticulitis     actually sounds like diverticulosis  . Allergy   . Fatty liver 12/2011    by [place]  . Sleep apnea     uses cpap  . Hypertension   . Hyperlipidemia     Past Surgical History  Procedure Laterality Date  . Appendectomy  1972  . Cervical discectomy  2001  . Pyloric stenosis  1960  . Colonoscopy N/A 06/07/2013    Procedure: COLONOSCOPY;  Surgeon: Inda Castle, MD;  Location: [facility];  Service: Endoscopy;  Laterality: N/A;    Family History  Problem Relation Age of Onset  . Hypertension Paternal Grandfather   . Colon cancer Father     Social History   Social History  . Marital Status: Married    Spouse Name: N/A  . Number of Children: 2  . Years of Education: N/A   Occupational History  . Third shift maintenance manager [employer]   Social History Main Topics  . Smoking status: Former Smoker    Types: Cigarettes    Quit date: 06/01/2005  . Smokeless tobacco: Former Systems developer  . Alcohol Use: 0.0 oz/week    0 Standard drinks or equivalent per week     Comment: rare  . Drug Use: No  . Sexual Activity: Not on file   Other Topics Concern  . Not on file   Social History Narrative   Review of Systems    Constitutional: Negative for fatigue.       Wears seat belt  HENT: Negative for dental problem, hearing loss and tinnitus.        Keeps up with dentist  Eyes: Negative for visual disturbance.       No diplopia or unilateral vision loss  Respiratory: Negative for cough, chest tightness and shortness of breath.   Cardiovascular: Positive for leg swelling. Negative for chest pain and palpitations.       End of day swelling resolves at night  Gastrointestinal: Negative for nausea, vomiting, abdominal pain, constipation and blood in stool.       Heartburn at times-- tums or zantac work. discussed  Endocrine: Negative for polydipsia and polyuria.  Genitourinary: Negative for urgency and difficulty urinating.       No sexual problems  Musculoskeletal: Positive for arthralgias. Negative for back pain and joint swelling.       Mild symptoms-- ibuprofen helps  Skin: Negative for rash.       No suspicious lesions  Allergic/Immunologic: Positive for environmental allergies. Negative for immunocompromised state.       Uses cetirizine  Neurological: Positive for headaches. Negative for dizziness, syncope, weakness and light-headedness.  Some neck related headache--ibuprofen helps  Hematological: Negative for adenopathy. Does not bruise/bleed easily.  Psychiatric/Behavioral: Negative for sleep disturbance and dysphoric mood. The patient is not nervous/anxious.        Objective:   Physical Exam  Constitutional: He is oriented to person, place, and time. He appears well-developed and well-nourished. No distress.  HENT:  Head: Normocephalic and atraumatic.  Right Ear: External ear normal.  Left Ear: External ear normal.  Mouth/Throat: Oropharynx is clear and moist. No oropharyngeal exudate.  Eyes: Conjunctivae are normal. Pupils are equal, round, and reactive to light.  Neck: Normal range of motion. Neck supple. No thyromegaly present.  Cardiovascular: Normal rate, regular rhythm, normal  heart sounds and intact distal pulses.  Exam reveals no gallop.   No murmur heard. Pulmonary/Chest: Effort normal and breath sounds normal. No respiratory distress. He has no wheezes. He has no rales.  Abdominal: Soft. There is no tenderness.  Musculoskeletal: He exhibits no edema or tenderness.  Lymphadenopathy:    He has no cervical adenopathy.  Neurological: He is alert and oriented to person, place, and time.  Skin: No rash noted. No erythema.  Psychiatric: He has a normal mood and affect. His behavior is normal.          Assessment & Plan:

## 2015-03-29 NOTE — Assessment & Plan Note (Signed)
BP Readings from Last 3 Encounters:  03/29/15 128/82  02/09/14 132/80  07/25/13 140/90   Good control

## 2015-03-29 NOTE — Progress Notes (Signed)
Pre visit review using our clinic review tool, if applicable. No additional management support is needed unless otherwise documented below in the visit note. 

## 2015-08-15 ENCOUNTER — Other Ambulatory Visit: Payer: Self-pay | Admitting: Internal Medicine

## 2016-01-13 DIAGNOSIS — L259 Unspecified contact dermatitis, unspecified cause: Secondary | ICD-10-CM | POA: Diagnosis not present

## 2016-02-23 ENCOUNTER — Other Ambulatory Visit: Payer: Self-pay | Admitting: Internal Medicine

## 2016-03-30 ENCOUNTER — Encounter: Payer: BLUE CROSS/BLUE SHIELD | Admitting: Internal Medicine

## 2016-04-20 ENCOUNTER — Encounter: Payer: Self-pay | Admitting: Gastroenterology

## 2016-04-21 ENCOUNTER — Encounter: Payer: Self-pay | Admitting: Internal Medicine

## 2016-04-21 ENCOUNTER — Ambulatory Visit (INDEPENDENT_AMBULATORY_CARE_PROVIDER_SITE_OTHER): Payer: BLUE CROSS/BLUE SHIELD | Admitting: Internal Medicine

## 2016-04-21 VITALS — BP 136/90 | HR 85 | Temp 97.8°F | Ht 72.25 in | Wt 282.0 lb

## 2016-04-21 DIAGNOSIS — E785 Hyperlipidemia, unspecified: Secondary | ICD-10-CM

## 2016-04-21 DIAGNOSIS — Z125 Encounter for screening for malignant neoplasm of prostate: Secondary | ICD-10-CM

## 2016-04-21 DIAGNOSIS — R7301 Impaired fasting glucose: Secondary | ICD-10-CM

## 2016-04-21 DIAGNOSIS — I10 Essential (primary) hypertension: Secondary | ICD-10-CM

## 2016-04-21 DIAGNOSIS — G4733 Obstructive sleep apnea (adult) (pediatric): Secondary | ICD-10-CM | POA: Diagnosis not present

## 2016-04-21 DIAGNOSIS — Z0000 Encounter for general adult medical examination without abnormal findings: Secondary | ICD-10-CM

## 2016-04-21 LAB — CBC WITH DIFFERENTIAL/PLATELET
Basophils Absolute: 0.1 10*3/uL (ref 0.0–0.1)
Basophils Relative: 0.6 % (ref 0.0–3.0)
Eosinophils Absolute: 0.2 10*3/uL (ref 0.0–0.7)
Eosinophils Relative: 2 % (ref 0.0–5.0)
HCT: 41.4 % (ref 39.0–52.0)
Hemoglobin: 14.3 g/dL (ref 13.0–17.0)
Lymphocytes Relative: 26.6 % (ref 12.0–46.0)
Lymphs Abs: 2.3 10*3/uL (ref 0.7–4.0)
MCHC: 34.6 g/dL (ref 30.0–36.0)
MCV: 91 fl (ref 78.0–100.0)
Monocytes Absolute: 0.7 10*3/uL (ref 0.1–1.0)
Monocytes Relative: 8.3 % (ref 3.0–12.0)
Neutro Abs: 5.3 10*3/uL (ref 1.4–7.7)
Neutrophils Relative %: 62.5 % (ref 43.0–77.0)
Platelets: 234 10*3/uL (ref 150.0–400.0)
RBC: 4.55 Mil/uL (ref 4.22–5.81)
RDW: 13 % (ref 11.5–15.5)
WBC: 8.5 10*3/uL (ref 4.0–10.5)

## 2016-04-21 LAB — LIPID PANEL
Cholesterol: 177 mg/dL (ref 0–200)
HDL: 47.3 mg/dL (ref >39.00)
LDL Cholesterol: 98 mg/dL (ref 0–99)
NonHDL: 129.97
Total CHOL/HDL Ratio: 4
Triglycerides: 159 mg/dL — ABNORMAL HIGH (ref 0.0–149.0)
VLDL: 31.8 mg/dL (ref 0.0–40.0)

## 2016-04-21 LAB — COMPREHENSIVE METABOLIC PANEL
ALT: 22 U/L (ref 0–53)
AST: 21 U/L (ref 0–37)
Albumin: 4.5 g/dL (ref 3.5–5.2)
Alkaline Phosphatase: 53 U/L (ref 39–117)
BUN: 16 mg/dL (ref 6–23)
CO2: 29 mEq/L (ref 19–32)
Calcium: 9.5 mg/dL (ref 8.4–10.5)
Chloride: 100 mEq/L (ref 96–112)
Creatinine, Ser: 0.91 mg/dL (ref 0.40–1.50)
GFR: 91.17 mL/min (ref >60.00)
Glucose, Bld: 85 mg/dL (ref 70–99)
Potassium: 3.8 mEq/L (ref 3.5–5.1)
Sodium: 138 mEq/L (ref 135–145)
Total Bilirubin: 0.7 mg/dL (ref 0.2–1.2)
Total Protein: 7.4 g/dL (ref 6.0–8.3)

## 2016-04-21 LAB — PSA: PSA: 0.69 ng/mL (ref 0.10–4.00)

## 2016-04-21 LAB — HEMOGLOBIN A1C: Hgb A1c MFr Bld: 5.8 % (ref 4.6–6.5)

## 2016-04-21 MED ORDER — TRIAMTERENE-HCTZ 37.5-25 MG PO TABS: 1.0000 | ORAL_TABLET | Freq: Every day | ORAL | 3 refills | Status: DC

## 2016-04-21 NOTE — Assessment & Plan Note (Signed)
BP Readings from Last 3 Encounters:  04/21/16 136/90  03/29/15 128/82  02/09/14 132/80   Reasonable control No change needed

## 2016-04-21 NOTE — Assessment & Plan Note (Signed)
Does well with CPAP Uses every night

## 2016-04-21 NOTE — Progress Notes (Signed)
Pre visit review using our clinic review tool, if applicable. No additional management support is needed unless otherwise documented below in the visit note. 

## 2016-04-21 NOTE — Assessment & Plan Note (Signed)
Prefers no statin for now

## 2016-04-21 NOTE — Progress Notes (Signed)
Subjective:    Patient ID: Anthony Bates, male    DOB: [birth year], 55 y.o.   MRN: [id number]  HPI Here for physical  No new concerns  Same job but now on first shift No change in [place] Still working on fitness--but gained back a few pounds Not really exercising  Current Outpatient Prescriptions on File Prior to Visit  Medication Sig Dispense Refill  . ibuprofen (ADVIL,MOTRIN) 200 MG tablet Take 400 mg by mouth every 6 (six) hours as needed for headache or moderate pain.    Marland Kitchen triamterene-hydrochlorothiazide (MAXZIDE-25) 37.5-25 MG tablet TAKE ONE TABLET BY MOUTH ONCE DAILY 90 tablet 0   No current facility-administered medications on file prior to visit.     No Known Allergies  Past Medical History:  Diagnosis Date  . Allergy   . Diverticulitis    actually sounds like diverticulosis  . Fatty liver 12/2011   by [place]  . Hx of colonic polyps     hx of-adenomatous  . Hyperlipidemia   . Hypertension   . Sleep apnea    uses cpap    Past Surgical History:  Procedure Laterality Date  . APPENDECTOMY  1972  . CERVICAL DISCECTOMY  2001  . COLONOSCOPY N/A 06/07/2013   Procedure: COLONOSCOPY;  Surgeon: Inda Castle, MD;  Location: [facility];  Service: Endoscopy;  Laterality: N/A;  . pyloric stenosis  1960    Family History  Problem Relation Age of Onset  . Hypertension Paternal Grandfather   . Colon cancer Father     Social History   Social History  . Marital status: Married    Spouse name: N/A  . Number of children: 2  . Years of education: N/A   Occupational History  . Maintenance manager [employer]   Social History Main Topics  . Smoking status: Former Smoker    Types: Cigarettes    Quit date: 06/01/2005  . Smokeless tobacco: Former Systems developer  . Alcohol use 0.0 oz/week     Comment: rare  . Drug use: No  . Sexual activity: Not on file   Other Topics Concern  . Not on file   Social History Narrative  . No narrative on file   Review of Systems    Constitutional: Negative for fatigue and unexpected weight change.       Wears seat belt  HENT: Negative for dental problem, hearing loss and tinnitus.        Keeps up with dentist  Eyes: Negative for visual disturbance.       No diplopia or unilateral vision loss  Respiratory: Negative for cough, chest tightness and shortness of breath.   Cardiovascular: Negative for chest pain and palpitations.       Slight ankle swelling at the end of the day  Gastrointestinal: Negative for abdominal pain, blood in stool, constipation, nausea and vomiting.       Occ heartburn--- tums or rolaids help  Endocrine: Negative for polydipsia and polyuria.  Genitourinary: Negative for difficulty urinating and urgency.       No sexual problems  Musculoskeletal: Positive for neck pain. Negative for arthralgias, back pain and joint swelling.       Uses tylenol or aspirin and is effective  Skin: Negative for rash.       No suspicious lesions  Allergic/Immunologic: Positive for environmental allergies. Negative for immunocompromised state.       Uses OTC as needed  Neurological: Positive for headaches. Negative for dizziness, syncope and light-headedness.  Neck pain causes occ headache  Hematological: Negative for adenopathy. Does not bruise/bleed easily.  Psychiatric/Behavioral: Negative for dysphoric mood and sleep disturbance. The patient is not nervous/anxious.        Objective:   Physical Exam  Constitutional: He is oriented to person, place, and time. He appears well-developed and well-nourished. No distress.  HENT:  Head: Normocephalic and atraumatic.  Right Ear: External ear normal.  Left Ear: External ear normal.  Mouth/Throat: Oropharynx is clear and moist. No oropharyngeal exudate.  Eyes: Conjunctivae are normal. Pupils are equal, round, and reactive to light.  Neck: Normal range of motion. Neck supple. No thyromegaly present.  Cardiovascular: Normal rate, regular rhythm, normal heart  sounds and intact distal pulses.  Exam reveals no gallop.   No murmur heard. Pulmonary/Chest: Effort normal and breath sounds normal. No respiratory distress. He has no wheezes. He has no rales.  Abdominal: Soft. There is no tenderness.  Musculoskeletal: He exhibits no edema or tenderness.  Lymphadenopathy:    He has no cervical adenopathy.  Neurological: He is alert and oriented to person, place, and time.  Skin: No rash noted. No erythema.  Psychiatric: He has a normal mood and affect. His behavior is normal.          Assessment & Plan:

## 2016-04-21 NOTE — Assessment & Plan Note (Signed)
Healthy Discussed adding some exercise on days off Will check PSA after discussion Colon due 1/18 Prefers no flu vaccine

## 2016-04-21 NOTE — Assessment & Plan Note (Signed)
Check labs today.

## 2017-04-28 ENCOUNTER — Encounter: Payer: Self-pay | Admitting: Internal Medicine

## 2017-04-28 ENCOUNTER — Ambulatory Visit (INDEPENDENT_AMBULATORY_CARE_PROVIDER_SITE_OTHER): Payer: BLUE CROSS/BLUE SHIELD | Admitting: Internal Medicine

## 2017-04-28 VITALS — BP 136/82 | HR 77 | Temp 97.7°F | Ht 72.5 in | Wt 300.5 lb

## 2017-04-28 DIAGNOSIS — Z0000 Encounter for general adult medical examination without abnormal findings: Secondary | ICD-10-CM

## 2017-04-28 DIAGNOSIS — I10 Essential (primary) hypertension: Secondary | ICD-10-CM

## 2017-04-28 DIAGNOSIS — Z86010 Personal history of colonic polyps: Secondary | ICD-10-CM | POA: Diagnosis not present

## 2017-04-28 LAB — CBC
HCT: 43.5 % (ref 39.0–52.0)
Hemoglobin: 14.7 g/dL (ref 13.0–17.0)
MCHC: 33.9 g/dL (ref 30.0–36.0)
MCV: 92.8 fl (ref 78.0–100.0)
Platelets: 216 10*3/uL (ref 150.0–400.0)
RBC: 4.69 Mil/uL (ref 4.22–5.81)
RDW: 13.4 % (ref 11.5–15.5)
WBC: 7.4 10*3/uL (ref 4.0–10.5)

## 2017-04-28 LAB — COMPREHENSIVE METABOLIC PANEL
ALT: 33 U/L (ref 0–53)
AST: 30 U/L (ref 0–37)
Albumin: 4.3 g/dL (ref 3.5–5.2)
Alkaline Phosphatase: 51 U/L (ref 39–117)
BUN: 18 mg/dL (ref 6–23)
CO2: 32 mEq/L (ref 19–32)
Calcium: 9.6 mg/dL (ref 8.4–10.5)
Chloride: 101 mEq/L (ref 96–112)
Creatinine, Ser: 1.04 mg/dL (ref 0.40–1.50)
GFR: 77.87 mL/min (ref >60.00)
Glucose, Bld: 110 mg/dL — ABNORMAL HIGH (ref 70–99)
Potassium: 4.7 mEq/L (ref 3.5–5.1)
Sodium: 139 mEq/L (ref 135–145)
Total Bilirubin: 0.7 mg/dL (ref 0.2–1.2)
Total Protein: 7.8 g/dL (ref 6.0–8.3)

## 2017-04-28 NOTE — Assessment & Plan Note (Signed)
Healthy but out of shape--discussed Prefers no flu vaccines Due for repeat colon Defer PSA till at least next year

## 2017-04-28 NOTE — Progress Notes (Signed)
Subjective:    Patient ID: Anthony Bates, male    DOB: 06/21/1960, 56 y.o.   MRN: [id number]  HPI Here for physical  He is doing well No new concerns No consistent with lifestyle efforts 12 hour shifts often-- still at [facility]--was due for recheck due to past polyps and poor prep last time  Current Outpatient Medications on File Prior to Visit  Medication Sig Dispense Refill  . ibuprofen (ADVIL,MOTRIN) 200 MG tablet Take 400 mg by mouth every 6 (six) hours as needed for headache or moderate pain.    Marland Kitchen triamterene-hydrochlorothiazide (MAXZIDE-25) 37.5-25 MG tablet Take 1 tablet by mouth daily. 90 tablet 3   No current facility-administered medications on file prior to visit.     No Known Allergies  Past Medical History:  Diagnosis Date  . Allergy   . Diverticulitis    actually sounds like diverticulosis  . Fatty liver 12/2011   by [place]  . Hx of colonic polyps     hx of-adenomatous  . Hyperlipidemia   . Hypertension   . Sleep apnea    uses cpap    Past Surgical History:  Procedure Laterality Date  . APPENDECTOMY  1972  . CERVICAL DISCECTOMY  2001  . COLONOSCOPY N/A 06/07/2013   Procedure: COLONOSCOPY;  Surgeon: Inda Castle, MD;  Location: [facility];  Service: Endoscopy;  Laterality: N/A;  . pyloric stenosis  1960    Family History  Problem Relation Age of Onset  . Hypertension Paternal Grandfather   . Colon cancer Father     Social History   Socioeconomic History  . Marital status: Married    Spouse name: Not on file  . Number of children: 2  . Years of education: Not on file  . Highest education level: Not on file  Social Needs  . Financial resource strain: Not on file  . Food insecurity - worry: Not on file  . Food insecurity - inability: Not on file  . Transportation needs - medical: Not on file  . Transportation needs - non-medical: Not on file  Occupational History  . Occupation: Chartered certified accountant: [employer]  Tobacco Use  . Smoking status: Former Smoker    Types: Cigarettes    Last attempt to quit: 06/01/2005    Years since quitting: 11.9  . Smokeless tobacco: Former Network engineer and Sexual Activity  . Alcohol use: Yes    Alcohol/week: 0.0 oz    Comment: rare  . Drug use: No  . Sexual activity: Not on file  Other Topics Concern  . Not on file  Social History Narrative  . Not on file   Review of Systems  Constitutional: Negative for fatigue.       Wears seat belt  HENT: Negative for dental problem, hearing loss, tinnitus and trouble swallowing.        Due for dentist  Eyes: Negative for visual disturbance.       No diplopia or unilateral vision loss  Respiratory: Negative for cough, chest tightness and shortness of breath.   Cardiovascular: Negative for chest pain and palpitations.       Ankle edema---gone after sleep. No change  Gastrointestinal: Negative for blood in stool and constipation.       No heartburn  Endocrine: Negative for polydipsia and polyuria.  Genitourinary: Negative for difficulty urinating and urgency.       No sexual problems  Musculoskeletal: Negative for arthralgias, back pain and  joint swelling.  Skin: Negative for rash.       No suspicious lesions  Allergic/Immunologic: Positive for environmental allergies. Negative for immunocompromised state.       Satisfied with OTC antihistamine  Neurological: Negative for dizziness, syncope and light-headedness.       Occ headaches  Hematological: Negative for adenopathy. Does not bruise/bleed easily.  Psychiatric/Behavioral: Negative for dysphoric mood and sleep disturbance. The patient is not nervous/anxious.        Objective:   Physical Exam  Constitutional: He is oriented to person, place, and time. No distress.  HENT:  Head: Normocephalic and atraumatic.  Right Ear: External ear normal.  Left Ear: External ear normal.  Mouth/Throat: Oropharynx is clear and moist. No oropharyngeal exudate.    Eyes: Conjunctivae are normal. Pupils are equal, round, and reactive to light.  Neck: No thyromegaly present.  Cardiovascular: Normal rate, regular rhythm, normal heart sounds and intact distal pulses. Exam reveals no gallop.  No murmur heard. Pulmonary/Chest: Effort normal and breath sounds normal. No respiratory distress. He has no wheezes. He has no rales.  Abdominal: Soft. He exhibits no distension. There is no tenderness. There is no rebound and no guarding.  Musculoskeletal: He exhibits no edema or tenderness.  Lymphadenopathy:    He has no cervical adenopathy.  Neurological: He is alert and oriented to person, place, and time.  Skin: No rash noted. No erythema.  Psychiatric: He has a normal mood and affect. His behavior is normal.          Assessment & Plan:

## 2017-04-28 NOTE — Assessment & Plan Note (Signed)
BP Readings from Last 3 Encounters:  04/28/17 136/82  04/21/16 136/90  03/29/15 128/82   Good control Due for labs

## 2017-05-28 ENCOUNTER — Encounter: Payer: Self-pay | Admitting: Internal Medicine

## 2017-05-28 ENCOUNTER — Encounter: Payer: Self-pay | Admitting: Gastroenterology

## 2017-07-08 ENCOUNTER — Encounter: Payer: Self-pay | Admitting: Internal Medicine

## 2017-07-08 MED ORDER — TRIAMTERENE-HCTZ 37.5-25 MG PO TABS: 1.0000 | ORAL_TABLET | Freq: Every day | ORAL | 3 refills | Status: DC

## 2017-07-09 ENCOUNTER — Other Ambulatory Visit: Payer: Self-pay

## 2017-07-09 ENCOUNTER — Ambulatory Visit (AMBULATORY_SURGERY_CENTER): Payer: Self-pay | Admitting: *Deleted

## 2017-07-09 VITALS — Ht 73.0 in | Wt 294.0 lb

## 2017-07-09 DIAGNOSIS — Z86010 Personal history of colonic polyps: Secondary | ICD-10-CM

## 2017-07-09 DIAGNOSIS — Z800 Family history of malignant neoplasm of digestive organs: Secondary | ICD-10-CM

## 2017-07-09 MED ORDER — NA SULFATE-K SULFATE-MG SULF 17.5-3.13-1.6 GM/177ML PO SOLN: ORAL | 0 refills | Status: DC

## 2017-07-09 NOTE — Progress Notes (Signed)
Patient denies any allergies to eggs or soy. Patient denies any problems with anesthesia/sedation. Patient denies any oxygen use at home. Patient denies taking any diet/weight loss medications or blood thinners. EMMI education declined by pt. 2 day prep given. Suprep coupon printed and given to patient today during pv.

## 2017-07-19 ENCOUNTER — Encounter: Payer: Self-pay | Admitting: Gastroenterology

## 2017-07-23 ENCOUNTER — Encounter: Payer: Self-pay | Admitting: Gastroenterology

## 2017-07-23 ENCOUNTER — Ambulatory Visit (AMBULATORY_SURGERY_CENTER): Payer: BLUE CROSS/BLUE SHIELD | Admitting: Gastroenterology

## 2017-07-23 ENCOUNTER — Other Ambulatory Visit: Payer: Self-pay

## 2017-07-23 VITALS — BP 103/61 | HR 71 | Temp 98.0°F | Resp 10 | Ht 73.0 in | Wt 294.0 lb

## 2017-07-23 DIAGNOSIS — Z86010 Personal history of colonic polyps: Secondary | ICD-10-CM | POA: Diagnosis present

## 2017-07-23 DIAGNOSIS — D123 Benign neoplasm of transverse colon: Secondary | ICD-10-CM | POA: Diagnosis not present

## 2017-07-23 DIAGNOSIS — Z800 Family history of malignant neoplasm of digestive organs: Secondary | ICD-10-CM | POA: Diagnosis not present

## 2017-07-23 DIAGNOSIS — K635 Polyp of colon: Secondary | ICD-10-CM

## 2017-07-23 MED ORDER — SODIUM CHLORIDE 0.9 % IV SOLN: 500.0000 mL | Freq: Once | INTRAVENOUS | Status: DC

## 2017-07-23 NOTE — Progress Notes (Signed)
Pt's states no medical or surgical changes since previsit or office visit. 

## 2017-07-23 NOTE — Progress Notes (Signed)
Called to room to assist during endoscopic procedure.  Patient ID and intended procedure confirmed with present staff. Received instructions for my participation in the procedure from the performing physician.  

## 2017-07-23 NOTE — Patient Instructions (Signed)
POLYP AND DIVERTICULOSISI information given.  NO IBUPROFEN, NAPROXEN, OR OTHER NON-STEROIDAL ANTI-INFLAMMATORY DRUGS FOR TWO WEEKS.  YOU HAD AN ENDOSCOPIC PROCEDURE TODAY AT [facility]:   Refer to the procedure report that was given to you for any specific questions about what was found during the examination.  If the procedure report does not answer your questions, please call your gastroenterologist to clarify.  If you requested that your care partner not be given the details of your procedure findings, then the procedure report has been included in a sealed envelope for you to review at your convenience later.  YOU SHOULD EXPECT: Some feelings of bloating in the abdomen. Passage of more gas than usual.  Walking can help get rid of the air that was put into your GI tract during the procedure and reduce the bloating. If you had a lower endoscopy (such as a colonoscopy or flexible sigmoidoscopy) you may notice spotting of blood in your stool or on the toilet paper. If you underwent a bowel prep for your procedure, you may not have a normal bowel movement for a few days.  Please Note:  You might notice some irritation and congestion in your nose or some drainage.  This is from the oxygen used during your procedure.  There is no need for concern and it should clear up in a day or so.  SYMPTOMS TO REPORT IMMEDIATELY:   Following lower endoscopy (colonoscopy or flexible sigmoidoscopy):  Excessive amounts of blood in the stool  Significant tenderness or worsening of abdominal pains  Swelling of the abdomen that is new, acute  Fever of 100F or higher   For urgent or emergent issues, a gastroenterologist can be reached at any hour by calling [phone].   DIET:  We do recommend a small meal at first, but then you may proceed to your regular diet.  Drink plenty of fluids but you should avoid alcoholic beverages for 24 hours.  ACTIVITY:  You should plan to take it easy for the  rest of today and you should NOT DRIVE or use heavy machinery until tomorrow (because of the sedation medicines used during the test).    FOLLOW UP: Our staff will call the number listed on your records the next business day following your procedure to check on you and address any questions or concerns that you may have regarding the information given to you following your procedure. If we do not reach you, we will leave a message.  However, if you are feeling well and you are not experiencing any problems, there is no need to return our call.  We will assume that you have returned to your regular daily activities without incident.  If any biopsies were taken you will be contacted by phone or by letter within the next 1-3 weeks.  Please call us at [phone] if you have not heard about the biopsies in 3 weeks.    SIGNATURES/CONFIDENTIALITY: You and/or your care partner have signed paperwork which will be entered into your electronic medical record.  These signatures attest to the fact that that the information above on your After Visit Summary has been reviewed and is understood.  Full responsibility of the confidentiality of this discharge information lies with you and/or your care-partner.

## 2017-07-23 NOTE — Op Note (Signed)
[facility] Patient Name: Anthony Bates Procedure Date: 07/23/2017 2:25 PM MRN: [id number] Endoscopist: Remo Lipps P. Oluwateniola Leitch MD, MD Age: 57 Referring MD:  Date of Birth: Feb 26, 1961 Gender: Male Account #: [id number] Procedure:                Colonoscopy Indications:              High risk colon cancer surveillance: Personal                            history of colonic polyps, father with colon cancer                            diagnosed around age 27 Medicines:                Monitored Anesthesia Care Procedure:                Pre-Anesthesia Assessment:                           - Prior to the procedure, a History and Physical                            was performed, and patient medications and                            allergies were reviewed. The patient's tolerance of                            previous anesthesia was also reviewed. The risks                            and benefits of the procedure and the sedation                            options and risks were discussed with the patient.                            All questions were answered, and informed consent                            was obtained. Prior Anticoagulants: The patient has                            taken no previous anticoagulant or antiplatelet                            agents. ASA Grade Assessment: II - A patient with                            mild systemic disease. After reviewing the risks                            and benefits, the patient was deemed in  satisfactory condition to undergo the procedure.                           After obtaining informed consent, the colonoscope                            was passed under direct vision. Throughout the                            procedure, the patient's blood pressure, pulse, and                            oxygen saturations were monitored continuously. The                            Colonoscope was introduced  through the anus and                            advanced to the the cecum, identified by                            appendiceal orifice and ileocecal valve. The                            colonoscopy was performed without difficulty. The                            patient tolerated the procedure well. The quality                            of the bowel preparation was adequate. The                            ileocecal valve, appendiceal orifice, and rectum                            were photographed. Scope In: 2:30:37 PM Scope Out: 2:46:06 PM Scope Withdrawal Time: 0 hours 13 minutes 45 seconds  Total Procedure Duration: 0 hours 15 minutes 29 seconds  Findings:                 The perianal and digital rectal examinations were                            normal.                           A 3 mm polyp was found in the transverse colon. The                            polyp was sessile. The polyp was removed with a                            cold snare. Resection and retrieval were complete.  A 4 mm polyp was found in the splenic flexure. The                            polyp was sessile. The polyp was removed with a                            cold snare. Resection and retrieval were complete.                           Multiple medium-mouthed diverticula were found in                            the sigmoid colon and descending colon.                           The exam was otherwise without abnormality on                            direct and retroflexion views. Complications:            No immediate complications. Estimated blood loss:                            Minimal. Estimated Blood Loss:     Estimated blood loss was minimal. Impression:               - One 3 mm polyp in the transverse colon, removed                            with a cold snare. Resected and retrieved.                           - One 4 mm polyp at the splenic flexure, removed                             with a cold snare. Resected and retrieved.                           - Diverticulosis in the sigmoid colon and in the                            descending colon.                           - The examination was otherwise normal on direct                            and retroflexion views. Recommendation:           - Patient has a contact number available for                            emergencies. The signs and symptoms of potential  delayed complications were discussed with the                            patient. Return to normal activities tomorrow.                            Written discharge instructions were provided to the                            patient.                           - Resume previous diet.                           - Continue present medications.                           - Await pathology results.                           - Repeat colonoscopy is recommended for                            surveillance in 5 years                           - No ibuprofen, naproxen, or other non-steroidal                            anti-inflammatory drugs for 2 weeks after polyp                            removal. Remo Lipps P. Esequiel Kleinfelter MD, MD 07/23/2017 2:50:26 PM This report has been signed electronically.

## 2017-07-23 NOTE — Progress Notes (Signed)
To recovery, report to RN, VSS. 

## 2017-07-26 ENCOUNTER — Telehealth: Payer: Self-pay

## 2017-07-26 NOTE — Telephone Encounter (Signed)
  Follow up Call-  Call back number 07/23/2017  Post procedure Call Back phone  # [phone]  Permission to leave phone message Yes  Some recent data might be hidden     Patient questions:  Do you have a fever, pain , or abdominal swelling? No. Pain Score  0 *  Have you tolerated food without any problems? Yes.    Have you been able to return to your normal activities? Yes.    Do you have any questions about your discharge instructions: Diet   No. Medications  No. Follow up visit  No.  Do you have questions or concerns about your Care? No.  Actions: * If pain score is 4 or above: No action needed, pain <4.

## 2017-08-01 ENCOUNTER — Encounter: Payer: Self-pay | Admitting: Gastroenterology

## 2018-05-04 ENCOUNTER — Ambulatory Visit (INDEPENDENT_AMBULATORY_CARE_PROVIDER_SITE_OTHER): Payer: BLUE CROSS/BLUE SHIELD | Admitting: Internal Medicine

## 2018-05-04 ENCOUNTER — Encounter: Payer: Self-pay | Admitting: Internal Medicine

## 2018-05-04 VITALS — BP 130/82 | HR 92 | Temp 97.7°F | Ht 73.0 in | Wt 293.0 lb

## 2018-05-04 DIAGNOSIS — Z0000 Encounter for general adult medical examination without abnormal findings: Secondary | ICD-10-CM | POA: Diagnosis not present

## 2018-05-04 DIAGNOSIS — Z125 Encounter for screening for malignant neoplasm of prostate: Secondary | ICD-10-CM | POA: Diagnosis not present

## 2018-05-04 DIAGNOSIS — I10 Essential (primary) hypertension: Secondary | ICD-10-CM

## 2018-05-04 LAB — CBC
HCT: 46.4 % (ref 39.0–52.0)
Hemoglobin: 15.7 g/dL (ref 13.0–17.0)
MCHC: 33.7 g/dL (ref 30.0–36.0)
MCV: 92.9 fl (ref 78.0–100.0)
Platelets: 234 10*3/uL (ref 150.0–400.0)
RBC: 5 Mil/uL (ref 4.22–5.81)
RDW: 13.3 % (ref 11.5–15.5)
WBC: 8.6 10*3/uL (ref 4.0–10.5)

## 2018-05-04 LAB — T4, FREE: Free T4: 0.8 ng/dL (ref 0.60–1.60)

## 2018-05-04 LAB — COMPREHENSIVE METABOLIC PANEL
ALT: 33 U/L (ref 0–53)
AST: 22 U/L (ref 0–37)
Albumin: 4.6 g/dL (ref 3.5–5.2)
Alkaline Phosphatase: 52 U/L (ref 39–117)
BUN: 17 mg/dL (ref 6–23)
CO2: 31 mEq/L (ref 19–32)
Calcium: 9.8 mg/dL (ref 8.4–10.5)
Chloride: 101 mEq/L (ref 96–112)
Creatinine, Ser: 1.16 mg/dL (ref 0.40–1.50)
GFR: 68.41 mL/min (ref >60.00)
Glucose, Bld: 119 mg/dL — ABNORMAL HIGH (ref 70–99)
Potassium: 4.2 mEq/L (ref 3.5–5.1)
Sodium: 140 mEq/L (ref 135–145)
Total Bilirubin: 0.3 mg/dL (ref 0.2–1.2)
Total Protein: 7.8 g/dL (ref 6.0–8.3)

## 2018-05-04 LAB — PSA: PSA: 0.93 ng/mL (ref 0.10–4.00)

## 2018-05-04 NOTE — Patient Instructions (Signed)
DASH Eating Plan DASH stands for "Dietary Approaches to Stop Hypertension." The DASH eating plan is a healthy eating plan that has been shown to reduce high blood pressure (hypertension). It may also reduce your risk for type 2 diabetes, heart disease, and stroke. The DASH eating plan may also help with weight loss. What are tips for following this plan? General guidelines  Avoid eating more than 2,300 mg (milligrams) of salt (sodium) a day. If you have hypertension, you may need to reduce your sodium intake to 1,500 mg a day.  Limit alcohol intake to no more than 1 drink a day for nonpregnant women and 2 drinks a day for men. One drink equals 12 oz of beer, 5 oz of wine, or 1 oz of hard liquor.  Work with your health care provider to maintain a healthy body weight or to lose weight. Ask what an ideal weight is for you.  Get at least 30 minutes of exercise that causes your heart to beat faster (aerobic exercise) most days of the week. Activities may include walking, swimming, or biking.  Work with your health care provider or diet and nutrition specialist (dietitian) to adjust your eating plan to your individual calorie needs. Reading food labels  Check food labels for the amount of sodium per serving. Choose foods with less than 5 percent of the Daily Value of sodium. Generally, foods with less than 300 mg of sodium per serving fit into this eating plan.  To find whole grains, look for the word "whole" as the first word in the ingredient list. Shopping  Buy products labeled as "low-sodium" or "no salt added."  Buy fresh foods. Avoid canned foods and premade or frozen meals. Cooking  Avoid adding salt when cooking. Use salt-free seasonings or herbs instead of table salt or sea salt. Check with your health care provider or pharmacist before using salt substitutes.  Do not fry foods. Cook foods using healthy methods such as baking, boiling, grilling, and broiling instead.  Cook with  heart-healthy oils, such as olive, canola, soybean, or sunflower oil. Meal planning   Eat a balanced diet that includes: ? 5 or more servings of fruits and vegetables each day. At each meal, try to fill half of your plate with fruits and vegetables. ? Up to 6-8 servings of whole grains each day. ? Less than 6 oz of lean meat, poultry, or fish each day. A 3-oz serving of meat is about the same size as a deck of cards. One egg equals 1 oz. ? 2 servings of low-fat dairy each day. ? A serving of nuts, seeds, or beans 5 times each week. ? Heart-healthy fats. Healthy fats called Omega-3 fatty acids are found in foods such as flaxseeds and coldwater fish, like sardines, salmon, and mackerel.  Limit how much you eat of the following: ? Canned or prepackaged foods. ? Food that is high in trans fat, such as fried foods. ? Food that is high in saturated fat, such as fatty meat. ? Sweets, desserts, sugary drinks, and other foods with added sugar. ? Full-fat dairy products.  Do not salt foods before eating.  Try to eat at least 2 vegetarian meals each week.  Eat more home-cooked food and less restaurant, buffet, and fast food.  When eating at a restaurant, ask that your food be prepared with less salt or no salt, if possible. What foods are recommended? The items listed may not be a complete list. Talk with your dietitian about what   dietary choices are best for you. Grains Whole-grain or whole-wheat bread. Whole-grain or whole-wheat pasta. Brown rice. Oatmeal. Quinoa. Bulgur. Whole-grain and low-sodium cereals. Pita bread. Low-fat, low-sodium crackers. Whole-wheat flour tortillas. Vegetables Fresh or frozen vegetables (raw, steamed, roasted, or grilled). Low-sodium or reduced-sodium tomato and vegetable juice. Low-sodium or reduced-sodium tomato sauce and tomato paste. Low-sodium or reduced-sodium canned vegetables. Fruits All fresh, dried, or frozen fruit. Canned fruit in natural juice (without  added sugar). Meat and other protein foods Skinless chicken or turkey. Ground chicken or turkey. Pork with fat trimmed off. Fish and seafood. Egg whites. Dried beans, peas, or lentils. Unsalted nuts, nut butters, and seeds. Unsalted canned beans. Lean cuts of beef with fat trimmed off. Low-sodium, lean deli meat. Dairy Low-fat (1%) or fat-free (skim) milk. Fat-free, low-fat, or reduced-fat cheeses. Nonfat, low-sodium ricotta or cottage cheese. Low-fat or nonfat yogurt. Low-fat, low-sodium cheese. Fats and oils Soft margarine without trans fats. Vegetable oil. Low-fat, reduced-fat, or light mayonnaise and salad dressings (reduced-sodium). Canola, safflower, olive, soybean, and sunflower oils. Avocado. Seasoning and other foods Herbs. Spices. Seasoning mixes without salt. Unsalted popcorn and pretzels. Fat-free sweets. What foods are not recommended? The items listed may not be a complete list. Talk with your dietitian about what dietary choices are best for you. Grains Baked goods made with fat, such as croissants, muffins, or some breads. Dry pasta or rice meal packs. Vegetables Creamed or fried vegetables. Vegetables in a cheese sauce. Regular canned vegetables (not low-sodium or reduced-sodium). Regular canned tomato sauce and paste (not low-sodium or reduced-sodium). Regular tomato and vegetable juice (not low-sodium or reduced-sodium). Pickles. Olives. Fruits Canned fruit in a light or heavy syrup. Fried fruit. Fruit in cream or butter sauce. Meat and other protein foods Fatty cuts of meat. Ribs. Fried meat. Bacon. Sausage. Bologna and other processed lunch meats. Salami. Fatback. Hotdogs. Bratwurst. Salted nuts and seeds. Canned beans with added salt. Canned or smoked fish. Whole eggs or egg yolks. Chicken or turkey with skin. Dairy Whole or 2% milk, cream, and half-and-half. Whole or full-fat cream cheese. Whole-fat or sweetened yogurt. Full-fat cheese. Nondairy creamers. Whipped toppings.  Processed cheese and cheese spreads. Fats and oils Butter. Stick margarine. Lard. Shortening. Ghee. Bacon fat. Tropical oils, such as coconut, palm kernel, or palm oil. Seasoning and other foods Salted popcorn and pretzels. Onion salt, garlic salt, seasoned salt, table salt, and sea salt. Worcestershire sauce. Tartar sauce. Barbecue sauce. Teriyaki sauce. Soy sauce, including reduced-sodium. Steak sauce. Canned and packaged gravies. Fish sauce. Oyster sauce. Cocktail sauce. Horseradish that you find on the shelf. Ketchup. Mustard. Meat flavorings and tenderizers. Bouillon cubes. Hot sauce and Tabasco sauce. Premade or packaged marinades. Premade or packaged taco seasonings. Relishes. Regular salad dressings. Where to find more information:  National Heart, Lung, and Blood Institute: www.nhlbi.nih.gov  American Heart Association: www.heart.org Summary  The DASH eating plan is a healthy eating plan that has been shown to reduce high blood pressure (hypertension). It may also reduce your risk for type 2 diabetes, heart disease, and stroke.  With the DASH eating plan, you should limit salt (sodium) intake to 2,300 mg a day. If you have hypertension, you may need to reduce your sodium intake to 1,500 mg a day.  When on the DASH eating plan, aim to eat more fresh fruits and vegetables, whole grains, lean proteins, low-fat dairy, and heart-healthy fats.  Work with your health care provider or diet and nutrition specialist (dietitian) to adjust your eating plan to your individual   calorie needs. This information is not intended to replace advice given to you by your health care provider. Make sure you discuss any questions you have with your health care provider. Document Released: 05/07/2011 Document Revised: 05/11/2016 Document Reviewed: 05/11/2016 Elsevier Interactive Patient Education  2018 Elsevier Inc.  

## 2018-05-04 NOTE — Assessment & Plan Note (Signed)
Healthy but needs to work on fitness PSA after discussion Colon due again 2024 DASH info Prefers no flu vaccine

## 2018-05-04 NOTE — Progress Notes (Signed)
Subjective:    Patient ID: Anthony Bates, male    DOB: [birth year], 59 y.o.   MRN: [id number]  HPI Here for PE  Only concern is right wrist pain Doesn't remember injury Worsening over that past 1-2 months Mostly when using hand ---mid wrist extensor and flexor, and up forearm slightly No hand weakness  Still working 12 hour days---5 days a week Not much time for exercise  Current Outpatient Medications on File Prior to Visit  Medication Sig Dispense Refill  . ibuprofen (ADVIL,MOTRIN) 200 MG tablet Take 400 mg by mouth every 6 (six) hours as needed for headache or moderate pain.    Marland Kitchen triamterene-hydrochlorothiazide (MAXZIDE-25) 37.5-25 MG tablet Take 1 tablet by mouth daily. 90 tablet 3   No current facility-administered medications on file prior to visit.     No Known Allergies  Past Medical History:  Diagnosis Date  . Allergy   . Diverticulitis    actually sounds like diverticulosis  . Fatty liver 12/2011   by [place]  . Hx of colonic polyps     hx of-adenomatous  . Hyperlipidemia   . Hypertension   . Sleep apnea    uses cpap    Past Surgical History:  Procedure Laterality Date  . APPENDECTOMY  1972  . CERVICAL DISCECTOMY  2001  . COLONOSCOPY N/A 06/07/2013   Procedure: COLONOSCOPY;  Surgeon: Inda Castle, MD;  Location: [facility];  Service: Endoscopy;  Laterality: N/A;  . COLONOSCOPY    . pyloric stenosis  1960    Family History  Problem Relation Age of Onset  . Hypertension Paternal Grandfather   . Colon cancer Father 76  . Cancer - Other Sister        thyrhoid cancer     Social History   Socioeconomic History  . Marital status: Married    Spouse name: Not on file  . Number of children: 2  . Years of education: Not on file  . Highest education level: Not on file  Occupational History  . Occupation: Chartered certified accountant: Scientist, product/process development  Social Needs  . Financial resource strain: Not on file  . Food insecurity:    Worry: Not on  file    Inability: Not on file  . Transportation needs:    Medical: Not on file    Non-medical: Not on file  Tobacco Use  . Smoking status: Former Smoker    Types: Cigarettes    Last attempt to quit: 06/01/2005    Years since quitting: 12.9  . Smokeless tobacco: Former Network engineer and Sexual Activity  . Alcohol use: No    Frequency: Never    Comment: rare  . Drug use: No  . Sexual activity: Not on file  Lifestyle  . Physical activity:    Days per week: Not on file    Minutes per session: Not on file  . Stress: Not on file  Relationships  . Social connections:    Talks on phone: Not on file    Gets together: Not on file    Attends religious service: Not on file    Active member of club or organization: Not on file    Attends meetings of clubs or organizations: Not on file    Relationship status: Not on file  . Intimate partner violence:    Fear of current or ex partner: Not on file    Emotionally abused: Not on file    Physically abused: Not on file  Forced sexual activity: Not on file  Other Topics Concern  . Not on file  Social History Narrative  . Not on file   Review of Systems  Constitutional: Negative for fatigue.       Wears seat belt Weight down slightly  HENT: Negative for dental problem, hearing loss, tinnitus and trouble swallowing.        Overdue for dentist  Eyes: Negative for visual disturbance.       No diplopia or unilateral vision loss  Respiratory: Negative for cough, chest tightness and shortness of breath.   Cardiovascular: Positive for leg swelling. Negative for chest pain and palpitations.  Gastrointestinal: Negative for abdominal pain, blood in stool and constipation.       Occasional heartburn--depends on what he eats. rolaids helps  Endocrine: Negative for polydipsia and polyuria.  Genitourinary: Negative for difficulty urinating and urgency.       No sexual problems  Musculoskeletal: Negative for back pain and joint swelling.  Skin:  Negative for rash.       No suspicious lesions  Allergic/Immunologic: Positive for environmental allergies. Negative for immunocompromised state.  Neurological: Negative for dizziness, syncope and light-headedness.       Some occipital headaches--usually at the end of the day. Ibuprofen helps  Hematological: Negative for adenopathy. Does not bruise/bleed easily.  Psychiatric/Behavioral: Negative for dysphoric mood and sleep disturbance. The patient is not nervous/anxious.        Objective:   Physical Exam  Constitutional: He is oriented to person, place, and time. He appears well-developed. No distress.  HENT:  Head: Normocephalic and atraumatic.  Right Ear: External ear normal.  Left Ear: External ear normal.  Mouth/Throat: Oropharynx is clear and moist. No oropharyngeal exudate.  Eyes: Pupils are equal, round, and reactive to light. Conjunctivae are normal.  Neck:  Thyroid palpable but not enlarged (over thoracic inlet)  Cardiovascular: Normal rate, regular rhythm, normal heart sounds and intact distal pulses. Exam reveals no gallop.  No murmur heard. Respiratory: Effort normal and breath sounds normal. No respiratory distress. He has no wheezes. He has no rales.  GI: Soft. There is no tenderness.  Musculoskeletal: He exhibits no tenderness.  Ankles are puffy Pain with full right wrist extension and flexion---but no weakness or other findings (discussed likely self limited)  Lymphadenopathy:    He has no cervical adenopathy.  Neurological: He is alert and oriented to person, place, and time.  Skin: No rash noted. No erythema.  Psychiatric: He has a normal mood and affect. His behavior is normal.           Assessment & Plan:

## 2018-05-04 NOTE — Assessment & Plan Note (Signed)
BP Readings from Last 3 Encounters:  05/04/18 130/82  07/23/17 103/61  04/28/17 136/82   Good control Will check labs

## 2018-07-18 ENCOUNTER — Other Ambulatory Visit: Payer: Self-pay | Admitting: Internal Medicine

## 2019-05-08 ENCOUNTER — Ambulatory Visit (INDEPENDENT_AMBULATORY_CARE_PROVIDER_SITE_OTHER): Payer: BC Managed Care – PPO | Admitting: Internal Medicine

## 2019-05-08 ENCOUNTER — Encounter: Payer: Self-pay | Admitting: Internal Medicine

## 2019-05-08 ENCOUNTER — Other Ambulatory Visit: Payer: Self-pay

## 2019-05-08 VITALS — BP 136/84 | HR 93 | Temp 97.6°F | Ht 72.75 in | Wt 290.0 lb

## 2019-05-08 DIAGNOSIS — J301 Allergic rhinitis due to pollen: Secondary | ICD-10-CM | POA: Diagnosis not present

## 2019-05-08 DIAGNOSIS — Z0000 Encounter for general adult medical examination without abnormal findings: Secondary | ICD-10-CM

## 2019-05-08 DIAGNOSIS — I10 Essential (primary) hypertension: Secondary | ICD-10-CM

## 2019-05-08 LAB — CBC
HCT: 43.4 % (ref 39.0–52.0)
Hemoglobin: 14.5 g/dL (ref 13.0–17.0)
MCHC: 33.3 g/dL (ref 30.0–36.0)
MCV: 93.6 fl (ref 78.0–100.0)
Platelets: 224 10*3/uL (ref 150.0–400.0)
RBC: 4.63 Mil/uL (ref 4.22–5.81)
RDW: 13.3 % (ref 11.5–15.5)
WBC: 7.3 10*3/uL (ref 4.0–10.5)

## 2019-05-08 LAB — COMPREHENSIVE METABOLIC PANEL
ALT: 23 U/L (ref 0–53)
AST: 20 U/L (ref 0–37)
Albumin: 4.3 g/dL (ref 3.5–5.2)
Alkaline Phosphatase: 54 U/L (ref 39–117)
BUN: 14 mg/dL (ref 6–23)
CO2: 32 mEq/L (ref 19–32)
Calcium: 9.3 mg/dL (ref 8.4–10.5)
Chloride: 99 mEq/L (ref 96–112)
Creatinine, Ser: 1.06 mg/dL (ref 0.40–1.50)
GFR: 71.17 mL/min (ref >60.00)
Glucose, Bld: 109 mg/dL — ABNORMAL HIGH (ref 70–99)
Potassium: 3.8 mEq/L (ref 3.5–5.1)
Sodium: 139 mEq/L (ref 135–145)
Total Bilirubin: 0.8 mg/dL (ref 0.2–1.2)
Total Protein: 7.4 g/dL (ref 6.0–8.3)

## 2019-05-08 NOTE — Progress Notes (Addendum)
Subjective:    Patient ID: Anthony Bates, male    DOB: 1960/08/19, 58 y.o.   MRN: [id number]  HPI Here for physical Still working full time in person ---night manager at [employer]  This visit occurred during the SARS-CoV-2 public health emergency.  Safety protocols were in place, including screening questions prior to the visit, additional usage of staff PPE, and extensive cleaning of exam room while observing appropriate contact time as indicated for disinfecting solutions.   No new health concerns Does have some stiffness--- left 3rd PIP in finger Pain at times Heat not helpful  Not really working on fitness Not careful with eating  Current Outpatient Medications on File Prior to Visit  Medication Sig Dispense Refill  . ibuprofen (ADVIL,MOTRIN) 200 MG tablet Take 400 mg by mouth every 6 (six) hours as needed for headache or moderate pain.    Marland Kitchen triamterene-hydrochlorothiazide (MAXZIDE-25) 37.5-25 MG tablet TAKE 1 TABLET BY MOUTH ONCE DAILY 30 tablet 11   No current facility-administered medications on file prior to visit.     No Known Allergies  Past Medical History:  Diagnosis Date  . Allergy   . Diverticulitis    actually sounds like diverticulosis  . Fatty liver 12/2011   by [place]  . Hx of colonic polyps     hx of-adenomatous  . Hyperlipidemia   . Hypertension   . Sleep apnea    uses cpap    Past Surgical History:  Procedure Laterality Date  . APPENDECTOMY  1972  . CERVICAL DISCECTOMY  2001  . COLONOSCOPY N/A 06/07/2013   Procedure: COLONOSCOPY;  Surgeon: Inda Castle, MD;  Location: [facility];  Service: Endoscopy;  Laterality: N/A;  . COLONOSCOPY    . pyloric stenosis  1960    Family History  Problem Relation Age of Onset  . Hypertension Paternal Grandfather   . Colon cancer Father 11  . Cancer - Other Sister        thyrhoid cancer     Social History   Socioeconomic History  . Marital status: Married    Spouse name: Not on file  . Number of  children: 2  . Years of education: Not on file  . Highest education level: Not on file  Occupational History  . Occupation: Chartered certified accountant: Scientist, product/process development  Social Needs  . Financial resource strain: Not on file  . Food insecurity    Worry: Not on file    Inability: Not on file  . Transportation needs    Medical: Not on file    Non-medical: Not on file  Tobacco Use  . Smoking status: Former Smoker    Types: Cigarettes    Quit date: 06/01/2005    Years since quitting: 13.9  . Smokeless tobacco: Former Network engineer and Sexual Activity  . Alcohol use: No    Frequency: Never    Comment: rare  . Drug use: No  . Sexual activity: Not on file  Lifestyle  . Physical activity    Days per week: Not on file    Minutes per session: Not on file  . Stress: Not on file  Relationships  . Social Herbalist on phone: Not on file    Gets together: Not on file    Attends religious service: Not on file    Active member of club or organization: Not on file    Attends meetings of clubs or organizations: Not on file  Relationship status: Not on file  . Intimate partner violence    Fear of current or ex partner: Not on file    Emotionally abused: Not on file    Physically abused: Not on file    Forced sexual activity: Not on file  Other Topics Concern  . Not on file  Social History Narrative  . Not on file   Review of Systems  Constitutional: Negative for fatigue.       Wears seat belt  HENT: Negative for dental problem, hearing loss, tinnitus and trouble swallowing.        Gets hearing checked yearly at work Keeps up with dentist  Eyes: Negative for visual disturbance.       No diplopia or unilateral vision loss  Respiratory: Negative for cough, chest tightness and shortness of breath.   Cardiovascular: Negative for chest pain and palpitations.       Gets leg swelling when on all day---- better when he awakens---occ wears support socks   Gastrointestinal: Negative for blood in stool and constipation.       Occ heartburn--- tums helps  Endocrine: Negative for polydipsia and polyuria.  Genitourinary: Negative for difficulty urinating and urgency.       No sexual problems  Musculoskeletal: Negative for back pain and joint swelling.       No other sig joint pain  Skin: Negative for rash.       No suspicious lesions  Allergic/Immunologic: Positive for environmental allergies. Negative for immunocompromised state.       Uses OTC zyrtec as needed  Neurological: Negative for dizziness, syncope, light-headedness and headaches.  Hematological: Negative for adenopathy. Does not bruise/bleed easily.  Psychiatric/Behavioral: Negative for dysphoric mood and sleep disturbance. The patient is not nervous/anxious.        Objective:   Physical Exam  Constitutional: He is oriented to person, place, and time. He appears well-developed. No distress.  HENT:  Head: Normocephalic and atraumatic.  Right Ear: External ear normal.  Left Ear: External ear normal.  Mouth/Throat: Oropharynx is clear and moist. No oropharyngeal exudate.  Eyes: Pupils are equal, round, and reactive to light. Conjunctivae are normal.  Neck: No thyromegaly present.  Cardiovascular: Normal rate, regular rhythm, normal heart sounds and intact distal pulses. Exam reveals no gallop.  No murmur heard. Respiratory: Effort normal and breath sounds normal. No respiratory distress. He has no wheezes. He has no rales.  GI: Soft. There is no abdominal tenderness.  Musculoskeletal:        General: No tenderness.     Comments: Mild ankle/foot edema Prominent superficial dilated veins  Lymphadenopathy:    He has no cervical adenopathy.  Neurological: He is alert and oriented to person, place, and time.  Skin: No rash noted. No erythema.  Psychiatric: He has a normal mood and affect. His behavior is normal.           Assessment & Plan:

## 2019-05-08 NOTE — Assessment & Plan Note (Signed)
Healthy ---unwilling to work on healthier lifestyle (but I discussed) Prefers no flu vaccine Urged him to take [place] when available Defer PSA to next year Colon due 2024

## 2019-05-08 NOTE — Assessment & Plan Note (Signed)
BP Readings from Last 3 Encounters:  05/08/19 136/84  05/04/18 130/82  07/23/17 103/61   Good control

## 2019-05-08 NOTE — Patient Instructions (Signed)
You can try over the counter diclofenac gel for the painful stiff finger joint.

## 2019-05-08 NOTE — Assessment & Plan Note (Signed)
Okay with OTC meds

## 2019-06-02 HISTORY — PX: ROTATOR CUFF REPAIR: SHX139

## 2019-07-24 MED ORDER — TRIAMTERENE-HCTZ 37.5-25 MG PO TABS: 1.0000 | ORAL_TABLET | Freq: Every day | ORAL | 3 refills | Status: DC

## 2020-03-23 DIAGNOSIS — Z20822 Contact with and (suspected) exposure to covid-19: Secondary | ICD-10-CM | POA: Diagnosis not present

## 2020-05-09 ENCOUNTER — Ambulatory Visit (INDEPENDENT_AMBULATORY_CARE_PROVIDER_SITE_OTHER): Payer: BC Managed Care – PPO | Admitting: Internal Medicine

## 2020-05-09 ENCOUNTER — Other Ambulatory Visit: Payer: Self-pay

## 2020-05-09 ENCOUNTER — Encounter: Payer: Self-pay | Admitting: Internal Medicine

## 2020-05-09 VITALS — BP 122/70 | HR 97 | Temp 97.0°F | Ht 72.5 in | Wt 290.0 lb

## 2020-05-09 DIAGNOSIS — Z0000 Encounter for general adult medical examination without abnormal findings: Secondary | ICD-10-CM

## 2020-05-09 DIAGNOSIS — I10 Essential (primary) hypertension: Secondary | ICD-10-CM | POA: Diagnosis not present

## 2020-05-09 DIAGNOSIS — G4733 Obstructive sleep apnea (adult) (pediatric): Secondary | ICD-10-CM | POA: Diagnosis not present

## 2020-05-09 NOTE — Patient Instructions (Signed)
DASH Eating Plan DASH stands for "Dietary Approaches to Stop Hypertension." The DASH eating plan is a healthy eating plan that has been shown to reduce high blood pressure (hypertension). It may also reduce your risk for type 2 diabetes, heart disease, and stroke. The DASH eating plan may also help with weight loss. What are tips for following this plan?  General guidelines  Avoid eating more than 2,300 mg (milligrams) of salt (sodium) a day. If you have hypertension, you may need to reduce your sodium intake to 1,500 mg a day.  Limit alcohol intake to no more than 1 drink a day for nonpregnant women and 2 drinks a day for men. One drink equals 12 oz of beer, 5 oz of wine, or 1 oz of hard liquor.  Work with your health care provider to maintain a healthy body weight or to lose weight. Ask what an ideal weight is for you.  Get at least 30 minutes of exercise that causes your heart to beat faster (aerobic exercise) most days of the week. Activities may include walking, swimming, or biking.  Work with your health care provider or diet and nutrition specialist (dietitian) to adjust your eating plan to your individual calorie needs. Reading food labels   Check food labels for the amount of sodium per serving. Choose foods with less than 5 percent of the Daily Value of sodium. Generally, foods with less than 300 mg of sodium per serving fit into this eating plan.  To find whole grains, look for the word "whole" as the first word in the ingredient list. Shopping  Buy products labeled as "low-sodium" or "no salt added."  Buy fresh foods. Avoid canned foods and premade or frozen meals. Cooking  Avoid adding salt when cooking. Use salt-free seasonings or herbs instead of table salt or sea salt. Check with your health care provider or pharmacist before using salt substitutes.  Do not fry foods. Cook foods using healthy methods such as baking, boiling, grilling, and broiling instead.  Cook with  heart-healthy oils, such as olive, canola, soybean, or sunflower oil. Meal planning  Eat a balanced diet that includes: ? 5 or more servings of fruits and vegetables each day. At each meal, try to fill half of your plate with fruits and vegetables. ? Up to 6-8 servings of whole grains each day. ? Less than 6 oz of lean meat, poultry, or fish each day. A 3-oz serving of meat is about the same size as a deck of cards. One egg equals 1 oz. ? 2 servings of low-fat dairy each day. ? A serving of nuts, seeds, or beans 5 times each week. ? Heart-healthy fats. Healthy fats called Omega-3 fatty acids are found in foods such as flaxseeds and coldwater fish, like sardines, salmon, and mackerel.  Limit how much you eat of the following: ? Canned or prepackaged foods. ? Food that is high in trans fat, such as fried foods. ? Food that is high in saturated fat, such as fatty meat. ? Sweets, desserts, sugary drinks, and other foods with added sugar. ? Full-fat dairy products.  Do not salt foods before eating.  Try to eat at least 2 vegetarian meals each week.  Eat more home-cooked food and less restaurant, buffet, and fast food.  When eating at a restaurant, ask that your food be prepared with less salt or no salt, if possible. What foods are recommended? The items listed may not be a complete list. Talk with your dietitian about   what dietary choices are best for you. Grains Whole-grain or whole-wheat bread. Whole-grain or whole-wheat pasta. Brown rice. Oatmeal. Quinoa. Bulgur. Whole-grain and low-sodium cereals. Pita bread. Low-fat, low-sodium crackers. Whole-wheat flour tortillas. Vegetables Fresh or frozen vegetables (raw, steamed, roasted, or grilled). Low-sodium or reduced-sodium tomato and vegetable juice. Low-sodium or reduced-sodium tomato sauce and tomato paste. Low-sodium or reduced-sodium canned vegetables. Fruits All fresh, dried, or frozen fruit. Canned fruit in natural juice (without  added sugar). Meat and other protein foods Skinless chicken or turkey. Ground chicken or turkey. Pork with fat trimmed off. Fish and seafood. Egg whites. Dried beans, peas, or lentils. Unsalted nuts, nut butters, and seeds. Unsalted canned beans. Lean cuts of beef with fat trimmed off. Low-sodium, lean deli meat. Dairy Low-fat (1%) or fat-free (skim) milk. Fat-free, low-fat, or reduced-fat cheeses. Nonfat, low-sodium ricotta or cottage cheese. Low-fat or nonfat yogurt. Low-fat, low-sodium cheese. Fats and oils Soft margarine without trans fats. Vegetable oil. Low-fat, reduced-fat, or light mayonnaise and salad dressings (reduced-sodium). Canola, safflower, olive, soybean, and sunflower oils. Avocado. Seasoning and other foods Herbs. Spices. Seasoning mixes without salt. Unsalted popcorn and pretzels. Fat-free sweets. What foods are not recommended? The items listed may not be a complete list. Talk with your dietitian about what dietary choices are best for you. Grains Baked goods made with fat, such as croissants, muffins, or some breads. Dry pasta or rice meal packs. Vegetables Creamed or fried vegetables. Vegetables in a cheese sauce. Regular canned vegetables (not low-sodium or reduced-sodium). Regular canned tomato sauce and paste (not low-sodium or reduced-sodium). Regular tomato and vegetable juice (not low-sodium or reduced-sodium). Pickles. Olives. Fruits Canned fruit in a light or heavy syrup. Fried fruit. Fruit in cream or butter sauce. Meat and other protein foods Fatty cuts of meat. Ribs. Fried meat. Bacon. Sausage. Bologna and other processed lunch meats. Salami. Fatback. Hotdogs. Bratwurst. Salted nuts and seeds. Canned beans with added salt. Canned or smoked fish. Whole eggs or egg yolks. Chicken or turkey with skin. Dairy Whole or 2% milk, cream, and half-and-half. Whole or full-fat cream cheese. Whole-fat or sweetened yogurt. Full-fat cheese. Nondairy creamers. Whipped toppings.  Processed cheese and cheese spreads. Fats and oils Butter. Stick margarine. Lard. Shortening. Ghee. Bacon fat. Tropical oils, such as coconut, palm kernel, or palm oil. Seasoning and other foods Salted popcorn and pretzels. Onion salt, garlic salt, seasoned salt, table salt, and sea salt. Worcestershire sauce. Tartar sauce. Barbecue sauce. Teriyaki sauce. Soy sauce, including reduced-sodium. Steak sauce. Canned and packaged gravies. Fish sauce. Oyster sauce. Cocktail sauce. Horseradish that you find on the shelf. Ketchup. Mustard. Meat flavorings and tenderizers. Bouillon cubes. Hot sauce and Tabasco sauce. Premade or packaged marinades. Premade or packaged taco seasonings. Relishes. Regular salad dressings. Where to find more information:  National Heart, Lung, and Blood Institute: www.nhlbi.nih.gov  American Heart Association: www.heart.org Summary  The DASH eating plan is a healthy eating plan that has been shown to reduce high blood pressure (hypertension). It may also reduce your risk for type 2 diabetes, heart disease, and stroke.  With the DASH eating plan, you should limit salt (sodium) intake to 2,300 mg a day. If you have hypertension, you may need to reduce your sodium intake to 1,500 mg a day.  When on the DASH eating plan, aim to eat more fresh fruits and vegetables, whole grains, lean proteins, low-fat dairy, and heart-healthy fats.  Work with your health care provider or diet and nutrition specialist (dietitian) to adjust your eating plan to your   individual calorie needs. This information is not intended to replace advice given to you by your health care provider. Make sure you discuss any questions you have with your health care provider. Document Revised: 04/30/2017 Document Reviewed: 05/11/2016 Elsevier Patient Education  2020 Elsevier Inc.  

## 2020-05-09 NOTE — Progress Notes (Signed)
Subjective:    Patient ID: Anthony Bates, male    DOB: 1961/05/29, 59 y.o.   MRN: [id number]  HPI Here for physical This visit occurred during the SARS-CoV-2 public health emergency.  Safety protocols were in place, including screening questions prior to the visit, additional usage of staff PPE, and extensive cleaning of exam room while observing appropriate contact time as indicated for disinfecting solutions.   Had injury to left shoulder at work--torn rotator cuff Needed repair then re-injured at work Had 2nd surgery about 6 weeks ago Now getting PT Didn't miss much work--but has to limit activities  No set exercise but walks at work much of the night  Current Outpatient Medications on File Prior to Visit  Medication Sig Dispense Refill  . ibuprofen (ADVIL,MOTRIN) 200 MG tablet Take 400 mg by mouth every 6 (six) hours as needed for headache or moderate pain.    Marland Kitchen triamterene-hydrochlorothiazide (MAXZIDE-25) 37.5-25 MG tablet Take 1 tablet by mouth daily. 90 tablet 3   No current facility-administered medications on file prior to visit.    No Known Allergies  Past Medical History:  Diagnosis Date  . Allergy   . Diverticulitis    actually sounds like diverticulosis  . Fatty liver 12/2011   by [place]  . Hx of colonic polyps     hx of-adenomatous  . Hyperlipidemia   . Hypertension   . Sleep apnea    uses cpap    Past Surgical History:  Procedure Laterality Date  . APPENDECTOMY  1972  . CERVICAL DISCECTOMY  2001  . COLONOSCOPY N/A 06/07/2013   Procedure: COLONOSCOPY;  Surgeon: Inda Castle, MD;  Location: [facility];  Service: Endoscopy;  Laterality: N/A;  . COLONOSCOPY    . pyloric stenosis  1960  . ROTATOR CUFF REPAIR Left 2021   twice    Family History  Problem Relation Age of Onset  . Hypertension Paternal Grandfather   . Colon cancer Father 81  . Cancer - Other Sister        thyrhoid cancer     Social History   Socioeconomic History  . Marital  status: Married    Spouse name: Not on file  . Number of children: 2  . Years of education: Not on file  . Highest education level: Not on file  Occupational History  . Occupation: Chartered certified accountant: [employer]  Tobacco Use  . Smoking status: Former Smoker    Types: Cigarettes    Quit date: 06/01/2005    Years since quitting: 14.9  . Smokeless tobacco: Former Network engineer  . Vaping Use: Never used  Substance and Sexual Activity  . Alcohol use: No    Comment: rare  . Drug use: No  . Sexual activity: Not on file  Other Topics Concern  . Not on file  Social History Narrative  . Not on file   Social Determinants of Health   Financial Resource Strain: Not on file  Food Insecurity: Not on file  Transportation Needs: Not on file  Physical Activity: Not on file  Stress: Not on file  Social Connections: Not on file  Intimate Partner Violence: Not on file   Review of Systems  Constitutional: Negative for fatigue and unexpected weight change.       Wears seat belt  HENT: Negative for dental problem, hearing loss, tinnitus and trouble swallowing.   Eyes: Negative for visual disturbance.       No diplopia or  unilateral vision loss   Respiratory: Negative for cough, chest tightness and shortness of breath.   Cardiovascular: Negative for chest pain, palpitations and leg swelling.  Gastrointestinal: Negative for blood in stool and constipation.       Occasional heartburn depending on diet---rolaids helps  Endocrine: Negative for polydipsia and polyuria.  Genitourinary: Negative for difficulty urinating and urgency.       No sexual problems  Musculoskeletal: Negative for back pain and joint swelling.       Occ right heel pain--if flexes foot hard (needle sensation)  Skin: Negative for rash.  Allergic/Immunologic: Positive for environmental allergies. Negative for immunocompromised state.       Satisfied with zyrtec  Neurological: Negative for dizziness,  syncope, light-headedness and headaches.  Hematological: Negative for adenopathy. Does not bruise/bleed easily.  Psychiatric/Behavioral: Negative for dysphoric mood. The patient is not nervous/anxious.        Trouble sleeping due to the shoulder---finally doing better now       Objective:   Physical Exam Constitutional:      Appearance: Normal appearance.  HENT:     Right Ear: Tympanic membrane, ear canal and external ear normal.     Left Ear: Tympanic membrane, ear canal and external ear normal.     Mouth/Throat:     Pharynx: No oropharyngeal exudate or posterior oropharyngeal erythema.  Eyes:     Conjunctiva/sclera: Conjunctivae normal.     Pupils: Pupils are equal, round, and reactive to light.  Cardiovascular:     Rate and Rhythm: Normal rate and regular rhythm.     Pulses: Normal pulses.     Heart sounds: No murmur heard. No gallop.   Pulmonary:     Effort: Pulmonary effort is normal.     Breath sounds: Normal breath sounds. No wheezing or rales.  Abdominal:     Palpations: Abdomen is soft.     Tenderness: There is no abdominal tenderness.  Musculoskeletal:     Cervical back: Neck supple.     Right lower leg: No edema.     Left lower leg: No edema.  Lymphadenopathy:     Cervical: No cervical adenopathy.  Skin:    General: Skin is warm.     Findings: No rash.  Neurological:     General: No focal deficit present.     Mental Status: He is alert and oriented to person, place, and time.  Psychiatric:        Mood and Affect: Mood normal.        Behavior: Behavior normal.            Assessment & Plan:

## 2020-05-09 NOTE — Assessment & Plan Note (Signed)
BP Readings from Last 3 Encounters:  05/09/20 122/70  05/08/19 136/84  05/04/18 130/82   Good control on dyazide

## 2020-05-09 NOTE — Assessment & Plan Note (Signed)
Sleeps better without the CPAP

## 2020-05-09 NOTE — Assessment & Plan Note (Signed)
Healthy but obese Fortunately didn't gain weight after 2 shoulder surgeries Discussed DASH eating and fitness Prefers no flu vaccine Urged him to get COVID Will consider shingrix Discussed PSA--- he wishes to hold off Colon due 2/24

## 2020-05-10 LAB — CBC
HCT: 47.4 % (ref 39.0–52.0)
Hemoglobin: 16.1 g/dL (ref 13.0–17.0)
MCHC: 33.9 g/dL (ref 30.0–36.0)
MCV: 92.3 fl (ref 78.0–100.0)
Platelets: 229 10*3/uL (ref 150.0–400.0)
RBC: 5.14 Mil/uL (ref 4.22–5.81)
RDW: 13.8 % (ref 11.5–15.5)
WBC: 8.2 10*3/uL (ref 4.0–10.5)

## 2020-05-10 LAB — COMPREHENSIVE METABOLIC PANEL
ALT: 35 U/L (ref 0–53)
AST: 28 U/L (ref 0–37)
Albumin: 4.5 g/dL (ref 3.5–5.2)
Alkaline Phosphatase: 47 U/L (ref 39–117)
BUN: 16 mg/dL (ref 6–23)
CO2: 29 mEq/L (ref 19–32)
Calcium: 9.6 mg/dL (ref 8.4–10.5)
Chloride: 101 mEq/L (ref 96–112)
Creatinine, Ser: 1.13 mg/dL (ref 0.40–1.50)
GFR: 70.21 mL/min (ref >60.00)
Glucose, Bld: 133 mg/dL — ABNORMAL HIGH (ref 70–99)
Potassium: 3.9 mEq/L (ref 3.5–5.1)
Sodium: 139 mEq/L (ref 135–145)
Total Bilirubin: 0.6 mg/dL (ref 0.2–1.2)
Total Protein: 8.1 g/dL (ref 6.0–8.3)

## 2020-10-11 MED ORDER — TRIAMTERENE-HCTZ 37.5-25 MG PO TABS: 1.0000 | ORAL_TABLET | Freq: Every day | ORAL | 3 refills | Status: DC

## 2021-05-12 ENCOUNTER — Ambulatory Visit (INDEPENDENT_AMBULATORY_CARE_PROVIDER_SITE_OTHER): Payer: BC Managed Care – PPO | Admitting: Internal Medicine

## 2021-05-12 ENCOUNTER — Other Ambulatory Visit: Payer: Self-pay

## 2021-05-12 ENCOUNTER — Encounter: Payer: Self-pay | Admitting: Internal Medicine

## 2021-05-12 VITALS — BP 130/86 | HR 82 | Temp 97.6°F | Ht 72.75 in | Wt 268.0 lb

## 2021-05-12 DIAGNOSIS — Z0000 Encounter for general adult medical examination without abnormal findings: Secondary | ICD-10-CM

## 2021-05-12 DIAGNOSIS — J301 Allergic rhinitis due to pollen: Secondary | ICD-10-CM

## 2021-05-12 DIAGNOSIS — I10 Essential (primary) hypertension: Secondary | ICD-10-CM

## 2021-05-12 DIAGNOSIS — Z23 Encounter for immunization: Secondary | ICD-10-CM

## 2021-05-12 NOTE — Assessment & Plan Note (Signed)
BP Readings from Last 3 Encounters:  05/12/21 130/86  05/09/20 122/70  05/08/19 136/84   Controlled with HCTZ/triamterene Will check labs

## 2021-05-12 NOTE — Patient Instructions (Signed)
You can use over the counter diclofenac gel as needed when your shoulder hurts.

## 2021-05-12 NOTE — Assessment & Plan Note (Signed)
Does okay with cetirizine in season

## 2021-05-12 NOTE — Assessment & Plan Note (Signed)
Colon due 2/24 Still prefers no PSA after discussion Will update Td Still prefers no other vaccines (COVID, flu, shingrix) Discussed adding resistance exercise

## 2021-05-12 NOTE — Addendum Note (Signed)
Addended by: Pilar Grammes on: 05/12/2021 04:19 PM   Modules accepted: Orders

## 2021-05-12 NOTE — Progress Notes (Signed)
Subjective:    Patient ID: Anthony Bates, male    DOB: 1960-08-13, 60 y.o.   MRN: [id number]  HPI Here for physical  Shoulder is some better Still has some symptoms---sudden pain, etc (but not persistent)  Has lost 20# Cut out late night snacks Walks a lot at work--no set exercise  Current Outpatient Medications on File Prior to Visit  Medication Sig Dispense Refill   ibuprofen (ADVIL,MOTRIN) 200 MG tablet Take 400 mg by mouth every 6 (six) hours as needed for headache or moderate pain.     triamterene-hydrochlorothiazide (MAXZIDE-25) 37.5-25 MG tablet Take 1 tablet by mouth daily. 90 tablet 3   No current facility-administered medications on file prior to visit.    No Known Allergies  Past Medical History:  Diagnosis Date   Allergy    Diverticulitis    actually sounds like diverticulosis   Fatty liver 12/2011   by [place]   Hx of colonic polyps     hx of-adenomatous   Hyperlipidemia    Hypertension    Sleep apnea    uses cpap    Past Surgical History:  Procedure Laterality Date   APPENDECTOMY  1972   CERVICAL DISCECTOMY  2001   COLONOSCOPY N/A 06/07/2013   Procedure: COLONOSCOPY;  Surgeon: Inda Castle, MD;  Location: [facility];  Service: Endoscopy;  Laterality: N/A;   COLONOSCOPY     pyloric stenosis  1960   ROTATOR CUFF REPAIR Left 2021   twice    Family History  Problem Relation Age of Onset   Hypertension Paternal Grandfather    Colon cancer Father 60   Cancer - Other Sister        thyrhoid cancer     Social History   Socioeconomic History   Marital status: Married    Spouse name: Not on file   Number of children: 2   Years of education: Not on file   Highest education level: Not on file  Occupational History   Occupation: Physiological scientist    Employer: [employer]  Tobacco Use   Smoking status: Former    Types: Cigarettes    Quit date: 06/01/2005    Years since quitting: 15.9   Smokeless tobacco: Former  Scientific laboratory technician  Use: Never used  Substance and Sexual Activity   Alcohol use: No    Comment: rare   Drug use: No   Sexual activity: Not on file  Other Topics Concern   Not on file  Social History Narrative   Not on file   Social Determinants of Health   Financial Resource Strain: Not on file  Food Insecurity: Not on file  Transportation Needs: Not on file  Physical Activity: Not on file  Stress: Not on file  Social Connections: Not on file  Intimate Partner Violence: Not on file   Review of Systems  Constitutional:  Negative for fatigue.       Wears seat belt  HENT:  Negative for dental problem, hearing loss and tinnitus.        Overdue for the dentist  Eyes:  Negative for visual disturbance.       No diplopia or unilateral vision loss  Respiratory:  Negative for cough, chest tightness and shortness of breath.   Cardiovascular:  Negative for chest pain.       Slight ankle swelling---at end of shift (discussed support socks)  Gastrointestinal:  Negative for blood in stool and constipation.       Some  heartburn---mylanta takes care of it Did have brief dysphagia  Endocrine: Negative for polydipsia and polyuria.  Genitourinary:  Negative for difficulty urinating and urgency.       No sexual problems  Musculoskeletal:  Negative for back pain.       Hand pain with weather change Some joint swelling around MCP---mostly just right  Skin:  Negative for rash.       Wife wants lump behind right shoulder checked  Allergic/Immunologic: Positive for environmental allergies. Negative for immunocompromised state.       Uses OTC cetirizine===was worse this year  Neurological:  Negative for dizziness, syncope and light-headedness.       Occ headaches--relates to neck (ibuprofen helps)  Hematological:  Negative for adenopathy. Does not bruise/bleed easily.  Psychiatric/Behavioral:  Negative for dysphoric mood and sleep disturbance. The patient is not nervous/anxious.       Objective:   Physical  Exam Constitutional:      Appearance: Normal appearance.  HENT:     Right Ear: Tympanic membrane and ear canal normal.     Left Ear: Tympanic membrane and ear canal normal.     Mouth/Throat:     Pharynx: No oropharyngeal exudate or posterior oropharyngeal erythema.  Eyes:     Conjunctiva/sclera: Conjunctivae normal.     Pupils: Pupils are equal, round, and reactive to light.  Cardiovascular:     Rate and Rhythm: Normal rate.     Pulses: Normal pulses.     Heart sounds: No murmur heard.   No gallop.  Pulmonary:     Effort: Pulmonary effort is normal.     Breath sounds: No wheezing or rales.  Abdominal:     Palpations: Abdomen is soft.     Tenderness: There is no abdominal tenderness.  Musculoskeletal:     Cervical back: Neck supple.     Right lower leg: No edema.     Left lower leg: No edema.     Comments: Superficial varicosities in feet Mild tenderness in right MCPs---but no true synovitis  Lymphadenopathy:     Cervical: No cervical adenopathy.  Skin:    Findings: No rash.     Comments: Cyst on back---not inflamed  Neurological:     Mental Status: He is alert.           Assessment & Plan:

## 2021-05-13 LAB — LIPID PANEL
Cholesterol: 146 mg/dL (ref 0–200)
HDL: 38.5 mg/dL — ABNORMAL LOW (ref >39.00)
LDL Cholesterol: 89 mg/dL (ref 0–99)
NonHDL: 107.74
Total CHOL/HDL Ratio: 4
Triglycerides: 95 mg/dL (ref 0.0–149.0)
VLDL: 19 mg/dL (ref 0.0–40.0)

## 2021-05-13 LAB — COMPREHENSIVE METABOLIC PANEL
ALT: 21 U/L (ref 0–53)
AST: 18 U/L (ref 0–37)
Albumin: 4.1 g/dL (ref 3.5–5.2)
Alkaline Phosphatase: 45 U/L (ref 39–117)
BUN: 14 mg/dL (ref 6–23)
CO2: 29 mEq/L (ref 19–32)
Calcium: 9.1 mg/dL (ref 8.4–10.5)
Chloride: 102 mEq/L (ref 96–112)
Creatinine, Ser: 0.92 mg/dL (ref 0.40–1.50)
GFR: 89.23 mL/min (ref >60.00)
Glucose, Bld: 90 mg/dL (ref 70–99)
Potassium: 3.9 mEq/L (ref 3.5–5.1)
Sodium: 139 mEq/L (ref 135–145)
Total Bilirubin: 0.4 mg/dL (ref 0.2–1.2)
Total Protein: 7 g/dL (ref 6.0–8.3)

## 2021-05-13 LAB — CBC
HCT: 42.1 % (ref 39.0–52.0)
Hemoglobin: 14.1 g/dL (ref 13.0–17.0)
MCHC: 33.4 g/dL (ref 30.0–36.0)
MCV: 91.8 fl (ref 78.0–100.0)
Platelets: 204 10*3/uL (ref 150.0–400.0)
RBC: 4.58 Mil/uL (ref 4.22–5.81)
RDW: 13.5 % (ref 11.5–15.5)
WBC: 5 10*3/uL (ref 4.0–10.5)

## 2021-05-13 LAB — SEDIMENTATION RATE: Sed Rate: 40 mm/hr — ABNORMAL HIGH (ref 0–20)

## 2021-10-10 ENCOUNTER — Other Ambulatory Visit: Payer: Self-pay | Admitting: Internal Medicine

## 2022-05-14 ENCOUNTER — Encounter: Payer: Self-pay | Admitting: Internal Medicine

## 2022-05-14 ENCOUNTER — Ambulatory Visit (INDEPENDENT_AMBULATORY_CARE_PROVIDER_SITE_OTHER): Payer: BC Managed Care – PPO | Admitting: Internal Medicine

## 2022-05-14 VITALS — BP 108/74 | HR 81 | Temp 97.9°F | Ht 72.75 in | Wt 280.4 lb

## 2022-05-14 DIAGNOSIS — Z0000 Encounter for general adult medical examination without abnormal findings: Secondary | ICD-10-CM | POA: Diagnosis not present

## 2022-05-14 DIAGNOSIS — I10 Essential (primary) hypertension: Secondary | ICD-10-CM

## 2022-05-14 NOTE — Assessment & Plan Note (Signed)
BP Readings from Last 3 Encounters:  05/14/22 108/74  05/12/21 130/86  05/09/20 122/70   Controlled on dyazide Will check labs

## 2022-05-14 NOTE — Assessment & Plan Note (Signed)
Healthy Discussed fitness and working on weight loss Colon due in February Still prefers no PSA ----or flu/COVID/shingrix vaccines

## 2022-05-14 NOTE — Progress Notes (Signed)
Subjective:    Patient ID: Anthony Bates, male    DOB: 18-Jan-1961, 61 y.o.   MRN: [id number]  HPI Here for physical  Has swelling in left elbow bursa---feels weird Some pain when he hits it Discussed bursa enlargement---it is not inflamed  Has nodule in left hand and occasional triggering in 4th finger Discussed this  Gained some of the weight back that he lost Tries to eat healthy No exercise other than work  Current Outpatient Medications on File Prior to Visit  Medication Sig Dispense Refill   ibuprofen (ADVIL,MOTRIN) 200 MG tablet Take 400 mg by mouth every 6 (six) hours as needed for headache or moderate pain.     triamterene-hydrochlorothiazide (MAXZIDE-25) 37.5-25 MG tablet TAKE 1 TABLET BY MOUTH EVERY DAY 90 tablet 3   No current facility-administered medications on file prior to visit.    No Known Allergies  Past Medical History:  Diagnosis Date   Allergy    Diverticulitis    actually sounds like diverticulosis   Fatty liver 12/2011   by [place]   Hx of colonic polyps     hx of-adenomatous   Hyperlipidemia    Hypertension    Sleep apnea    uses cpap    Past Surgical History:  Procedure Laterality Date   APPENDECTOMY  1972   CERVICAL DISCECTOMY  2001   COLONOSCOPY N/A 06/07/2013   Procedure: COLONOSCOPY;  Surgeon: Inda Castle, MD;  Location: [facility];  Service: Endoscopy;  Laterality: N/A;   COLONOSCOPY     pyloric stenosis  1960   ROTATOR CUFF REPAIR Left 2021   twice    Family History  Problem Relation Age of Onset   Hypertension Paternal Grandfather    Colon cancer Father 32   Cancer - Other Sister        thyrhoid cancer     Social History   Socioeconomic History   Marital status: Married    Spouse name: Not on file   Number of children: 2   Years of education: Not on file   Highest education level: Not on file  Occupational History   Occupation: Physiological scientist    Employer: [employer]  Tobacco Use   Smoking status:  Former    Types: Cigarettes    Quit date: 06/01/2005    Years since quitting: 16.9   Smokeless tobacco: Former  Scientific laboratory technician Use: Never used  Substance and Sexual Activity   Alcohol use: No    Comment: rare   Drug use: No   Sexual activity: Not on file  Other Topics Concern   Not on file  Social History Narrative   Not on file   Social Determinants of Health   Financial Resource Strain: Not on file  Food Insecurity: Not on file  Transportation Needs: Not on file  Physical Activity: Not on file  Stress: Not on file  Social Connections: Not on file  Intimate Partner Violence: Not on file   Review of Systems  Constitutional:  Negative for fatigue.       Weight up some  Wears seat belt  HENT:  Negative for dental problem, hearing loss, tinnitus and trouble swallowing.        Overdue for dentist  Eyes:        Right eye blurry at times---fatigued  Respiratory:  Negative for cough, chest tightness and shortness of breath.   Cardiovascular:  Negative for chest pain and palpitations.  Some ankle swelling at end of day---better in AM (discussed support socks)  Gastrointestinal:  Negative for blood in stool and constipation.       Occ heartburn--better when he holds off on eating at night. Rolaids work  Endocrine: Negative for polydipsia and polyuria.  Genitourinary:  Negative for difficulty urinating and urgency.       No sex --no problem  Musculoskeletal:  Negative for arthralgias, back pain and joint swelling.  Skin:  Negative for rash.  Allergic/Immunologic: Positive for environmental allergies. Negative for immunocompromised state.       Cetirizine is effective  Neurological:  Negative for dizziness, syncope, light-headedness and headaches.  Hematological:  Negative for adenopathy. Does not bruise/bleed easily.  Psychiatric/Behavioral:  Negative for dysphoric mood and sleep disturbance. The patient is not nervous/anxious.        No CPAP--sleeps on wedge pillow  and that stops snoring       Objective:   Physical Exam Constitutional:      Appearance: Normal appearance.  HENT:     Left Ear: External ear normal.     Mouth/Throat:     Pharynx: No oropharyngeal exudate or posterior oropharyngeal erythema.  Eyes:     Conjunctiva/sclera: Conjunctivae normal.     Pupils: Pupils are equal, round, and reactive to light.  Cardiovascular:     Rate and Rhythm: Normal rate and regular rhythm.     Heart sounds: No murmur heard.    No gallop.     Comments: Faint pedal pulses Pulmonary:     Effort: Pulmonary effort is normal.     Breath sounds: Normal breath sounds. No wheezing or rales.  Abdominal:     Palpations: Abdomen is soft.     Tenderness: There is no abdominal tenderness.  Musculoskeletal:     Cervical back: Neck supple.     Right lower leg: No edema.     Left lower leg: No edema.  Lymphadenopathy:     Cervical: No cervical adenopathy.  Skin:    Findings: No lesion or rash.  Neurological:     General: No focal deficit present.     Mental Status: He is alert and oriented to person, place, and time.  Psychiatric:        Mood and Affect: Mood normal.        Behavior: Behavior normal.            Assessment & Plan:

## 2022-05-15 LAB — COMPREHENSIVE METABOLIC PANEL
ALT: 34 U/L (ref 0–53)
AST: 29 U/L (ref 0–37)
Albumin: 4.6 g/dL (ref 3.5–5.2)
Alkaline Phosphatase: 49 U/L (ref 39–117)
BUN: 15 mg/dL (ref 6–23)
CO2: 29 mEq/L (ref 19–32)
Calcium: 10.1 mg/dL (ref 8.4–10.5)
Chloride: 98 mEq/L (ref 96–112)
Creatinine, Ser: 1.09 mg/dL (ref 0.40–1.50)
GFR: 72.29 mL/min (ref >60.00)
Glucose, Bld: 92 mg/dL (ref 70–99)
Potassium: 4.3 mEq/L (ref 3.5–5.1)
Sodium: 138 mEq/L (ref 135–145)
Total Bilirubin: 0.6 mg/dL (ref 0.2–1.2)
Total Protein: 8.1 g/dL (ref 6.0–8.3)

## 2022-05-15 LAB — CBC
HCT: 47.9 % (ref 39.0–52.0)
Hemoglobin: 16.2 g/dL (ref 13.0–17.0)
MCHC: 33.8 g/dL (ref 30.0–36.0)
MCV: 93.9 fl (ref 78.0–100.0)
Platelets: 258 10*3/uL (ref 150.0–400.0)
RBC: 5.1 Mil/uL (ref 4.22–5.81)
RDW: 14 % (ref 11.5–15.5)
WBC: 8.3 10*3/uL (ref 4.0–10.5)

## 2022-07-30 ENCOUNTER — Encounter: Payer: Self-pay | Admitting: Gastroenterology

## 2022-11-13 ENCOUNTER — Encounter: Payer: Self-pay | Admitting: Gastroenterology

## 2022-11-30 ENCOUNTER — Other Ambulatory Visit: Payer: Self-pay | Admitting: Internal Medicine

## 2022-12-09 ENCOUNTER — Ambulatory Visit: Payer: Commercial Managed Care - PPO

## 2022-12-09 VITALS — Ht 73.0 in | Wt 275.0 lb

## 2022-12-09 DIAGNOSIS — Z800 Family history of malignant neoplasm of digestive organs: Secondary | ICD-10-CM

## 2022-12-09 DIAGNOSIS — Z86010 Personal history of colonic polyps: Secondary | ICD-10-CM

## 2022-12-09 MED ORDER — NA SULFATE-K SULFATE-MG SULF 17.5-3.13-1.6 GM/177ML PO SOLN
1.0000 | Freq: Once | ORAL | 0 refills | Status: AC
Start: 2022-12-09 — End: 2022-12-09

## 2022-12-09 NOTE — Progress Notes (Signed)
No egg or soy allergy known to patient  No issues known to pt with past sedation with any surgeries or procedures Patient denies ever being told they had issues or difficulty with intubation  No FH of Malignant Hyperthermia Pt is not on diet pills Pt is not on  home 02  Pt is not on blood thinners  Pt denies issues with constipation  No A fib or A flutter Have any cardiac testing pending--no Pt instructed to use Singlecare.com or GoodRx for a price reduction on prep  Patient's chart reviewed by Cathlyn Parsons CNRA prior to previsit and patient appropriate for the [facility].  Previsit completed and red dot placed by patient's name on their procedure day (on provider's schedule).   Patient's chart reviewed by Cathlyn Parsons CNRA prior to previsit and patient appropriate for the [facility].  Previsit completed and red dot placed by patient's name on their procedure day (on provider's schedule).   Can ambulate independently

## 2022-12-22 ENCOUNTER — Encounter: Payer: Self-pay | Admitting: Gastroenterology

## 2022-12-29 ENCOUNTER — Encounter: Payer: Self-pay | Admitting: Gastroenterology

## 2022-12-29 ENCOUNTER — Ambulatory Visit: Payer: Commercial Managed Care - PPO | Admitting: Gastroenterology

## 2022-12-29 VITALS — BP 104/71 | HR 68 | Temp 97.0°F | Resp 16 | Ht 72.75 in | Wt 280.0 lb

## 2022-12-29 DIAGNOSIS — Z09 Encounter for follow-up examination after completed treatment for conditions other than malignant neoplasm: Secondary | ICD-10-CM

## 2022-12-29 DIAGNOSIS — Z800 Family history of malignant neoplasm of digestive organs: Secondary | ICD-10-CM

## 2022-12-29 DIAGNOSIS — D127 Benign neoplasm of rectosigmoid junction: Secondary | ICD-10-CM | POA: Diagnosis not present

## 2022-12-29 DIAGNOSIS — Z86010 Personal history of colonic polyps: Secondary | ICD-10-CM

## 2022-12-29 DIAGNOSIS — K635 Polyp of colon: Secondary | ICD-10-CM

## 2022-12-29 MED ORDER — SODIUM CHLORIDE 0.9 % IV SOLN
500.0000 mL | Freq: Once | INTRAVENOUS | Status: DC
Start: 2022-12-29 — End: 2022-12-29

## 2022-12-29 NOTE — Progress Notes (Signed)
VS by EP  Pt's states no medical or surgical changes since previsit or office visit.

## 2022-12-29 NOTE — Patient Instructions (Signed)

## 2022-12-29 NOTE — Op Note (Signed)
[facility] Patient Name: Anthony Bates Procedure Date: 12/29/2022 10:38 AM MRN: [id number] Endoscopist: Viviann Spare P. Adela Lank , MD, [id number] Age: 62 Referring MD:  Date of Birth: 08/29/1960 Gender: Male Account #: [id number] Procedure:                Colonoscopy Indications:              Screening in patient at increased risk: father with                            colon cancer dx age 98, history of colon polyps -                            adenomas removed 07/2017 Medicines:                Monitored Anesthesia Care Procedure:                Pre-Anesthesia Assessment:                           - Prior to the procedure, a History and Physical                            was performed, and patient medications and                            allergies were reviewed. The patient's tolerance of                            previous anesthesia was also reviewed. The risks                            and benefits of the procedure and the sedation                            options and risks were discussed with the patient.                            All questions were answered, and informed consent                            was obtained. Prior Anticoagulants: The patient has                            taken no anticoagulant or antiplatelet agents. ASA                            Grade Assessment: III - A patient with severe                            systemic disease. After reviewing the risks and                            benefits, the patient was deemed in satisfactory  condition to undergo the procedure.                           After obtaining informed consent, the colonoscope                            was passed under direct vision. Throughout the                            procedure, the patient's blood pressure, pulse, and                            oxygen saturations were monitored continuously. The                            Olympus CF-HQ190L  ([id number]) Colonoscope was                            introduced through the anus and advanced to the the                            cecum, identified by appendiceal orifice and                            ileocecal valve. The colonoscopy was performed                            without difficulty. The patient tolerated the                            procedure well. The quality of the bowel                            preparation was good. The ileocecal valve,                            appendiceal orifice, and rectum were photographed. Scope In: 10:43:49 AM Scope Out: 10:59:03 AM Scope Withdrawal Time: 0 hours 9 minutes 36 seconds  Total Procedure Duration: 0 hours 15 minutes 14 seconds  Findings:                 The perianal and digital rectal examinations were                            normal.                           Multiple small-mouthed diverticula were found in                            the sigmoid colon.                           A 3 mm polyp was found in the recto-sigmoid colon.  The polyp was sessile. The polyp was removed with a                            cold snare. Resection and retrieval were complete.                           Internal hemorrhoids were found during retroflexion.                           The exam was otherwise without abnormality. Complications:            No immediate complications. Estimated blood loss:                            Minimal. Estimated Blood Loss:     Estimated blood loss was minimal. Impression:               - Diverticulosis in the sigmoid colon.                           - One 3 mm polyp at the recto-sigmoid colon,                            removed with a cold snare. Resected and retrieved.                           - Internal hemorrhoids.                           - The examination was otherwise normal. Recommendation:           - Patient has a contact number available for                             emergencies. The signs and symptoms of potential                            delayed complications were discussed with the                            patient. Return to normal activities tomorrow.                            Written discharge instructions were provided to the                            patient.                           - Resume previous diet.                           - Continue present medications.                           - Await pathology results. Anticipate repeat  colonoscopy in 5 years given strong family history                            of colon cancer. Viviann Spare P. Tameia Rafferty, MD 12/29/2022 11:07:49 AM This report has been signed electronically.

## 2022-12-29 NOTE — Progress Notes (Signed)
[facility] History and Physical   Primary Care Physician:  Karie Schwalbe, MD   Reason for Procedure:   Family history of colon cancer, history of colon polyps  Plan:    colonoscopy     HPI: Anthony Bates is a 62 y.o. male  here for colonoscopy surveillance - last exam 2019 - 2 adenomas. Father had CRC dx age 28.   Patient denies any bowel symptoms at this time.Otherwise feels well without any cardiopulmonary symptoms.   I have discussed risks / benefits of anesthesia and endoscopic procedure with Lerry Liner and they wish to proceed with the exams as outlined today.    Past Medical History:  Diagnosis Date   Allergy    Diverticulitis    actually sounds like diverticulosis   Fatty liver 12/2011   by [place]   Hx of colonic polyps     hx of-adenomatous   Hyperlipidemia    Hypertension    Sleep apnea    uses cpap    Past Surgical History:  Procedure Laterality Date   APPENDECTOMY  1972   CERVICAL DISCECTOMY  2001   COLONOSCOPY N/A 06/07/2013   Procedure: COLONOSCOPY;  Surgeon: Louis Meckel, MD;  Location: [facility];  Service: Endoscopy;  Laterality: N/A;   COLONOSCOPY     pyloric stenosis  1960   ROTATOR CUFF REPAIR Left 2021   twice    Prior to Admission medications   Medication Sig Start Date End Date Taking? Authorizing Provider  ibuprofen (ADVIL,MOTRIN) 200 MG tablet Take 400 mg by mouth every 6 (six) hours as needed for headache or moderate pain.   Yes [provider]  triamterene-hydrochlorothiazide (MAXZIDE-25) 37.5-25 MG tablet TAKE 1 TABLET BY MOUTH EVERY DAY 12/01/22  Yes Karie Schwalbe, MD    Current Outpatient Medications  Medication Sig Dispense Refill   ibuprofen (ADVIL,MOTRIN) 200 MG tablet Take 400 mg by mouth every 6 (six) hours as needed for headache or moderate pain.     triamterene-hydrochlorothiazide (MAXZIDE-25) 37.5-25 MG tablet TAKE 1 TABLET BY MOUTH EVERY DAY 90 tablet 1   Current Facility-Administered  Medications  Medication Dose Route Frequency Provider Last Rate Last Admin   0.9 %  sodium chloride infusion  500 mL Intravenous Once Aiken Withem, Willaim Rayas, MD        Allergies as of 12/29/2022   (No Known Allergies)    Family History  Problem Relation Age of Onset   Rectal cancer Father    Colon cancer Father 51   Colon polyps Sister    Cancer - Other Sister        thyrhoid cancer    Hypertension Paternal Grandfather    Esophageal cancer Neg Hx    Stomach cancer Neg Hx     Social History   Socioeconomic History   Marital status: Married    Spouse name: Not on file   Number of children: 2   Years of education: Not on file   Highest education level: Not on file  Occupational History   Occupation: Games developer    Employer: [employer]  Tobacco Use   Smoking status: Former    Current packs/day: 0.00    Types: Cigarettes    Quit date: 06/01/2005    Years since quitting: 17.5   Smokeless tobacco: Former  Building services engineer status: Never Used  Substance and Sexual Activity   Alcohol use: No    Comment: rare   Drug use: No   Sexual activity: Not  on file  Other Topics Concern   Not on file  Social History Narrative   Not on file   Social Determinants of Health   Financial Resource Strain: Not on file  Food Insecurity: Not on file  Transportation Needs: Not on file  Physical Activity: Not on file  Stress: No Stress Concern Present (03/28/2020)   Received from [facility] of Occupational Health - Occupational Stress Questionnaire    Feeling of Stress : Not at all  Social Connections: Unknown (10/14/2021)   Received from [facility]   Social Network    Social Network: Not on file  Intimate Partner Violence: Unknown (09/05/2021)   Received from [facility]   HITS    Physically Hurt: Not on file    Insult or Talk Down To: Not on file    Threaten Physical Harm: Not on file    Scream or Curse: Not on file    Review of  Systems: All other review of systems negative except as mentioned in the HPI.  Physical Exam: Vital signs BP (!) 145/79   Pulse (!) 56   Temp (!) 97 F (36.1 C) (Skin)   Ht 6' 0.75" (1.848 m)   Wt 280 lb (127 kg)   SpO2 95%   BMI 37.20 kg/m   General:   Alert,  Well-developed, pleasant and cooperative in NAD Lungs:  Clear throughout to auscultation.   Heart:  Regular rate and rhythm Abdomen:  Soft, nontender and nondistended.   Neuro/Psych:  Alert and cooperative. Normal mood and affect. A and O x 3  Harlin Rain, MD [facility]

## 2022-12-29 NOTE — Progress Notes (Signed)
Called to room to assist during endoscopic procedure.  Patient ID and intended procedure confirmed with present staff. Received instructions for my participation in the procedure from the performing physician.  

## 2022-12-29 NOTE — Progress Notes (Signed)
Vss nad trans nto pacu 

## 2022-12-30 ENCOUNTER — Telehealth: Payer: Self-pay | Admitting: *Deleted

## 2022-12-30 NOTE — Telephone Encounter (Signed)
Attempted to call patient for their post-procedure follow-up call. No answer. Left voicemail.   

## 2023-01-06 ENCOUNTER — Encounter: Payer: Self-pay | Admitting: Gastroenterology

## 2023-05-20 ENCOUNTER — Encounter: Payer: Self-pay | Admitting: Internal Medicine

## 2023-05-20 ENCOUNTER — Ambulatory Visit (INDEPENDENT_AMBULATORY_CARE_PROVIDER_SITE_OTHER): Payer: Commercial Managed Care - PPO | Admitting: Internal Medicine

## 2023-05-20 VITALS — BP 136/84 | HR 75 | Temp 98.0°F | Ht 72.5 in | Wt 297.0 lb

## 2023-05-20 DIAGNOSIS — Z0000 Encounter for general adult medical examination without abnormal findings: Secondary | ICD-10-CM | POA: Diagnosis not present

## 2023-05-20 DIAGNOSIS — I10 Essential (primary) hypertension: Secondary | ICD-10-CM | POA: Diagnosis not present

## 2023-05-20 MED ORDER — TRIAMTERENE-HCTZ 37.5-25 MG PO TABS: 1.0000 | ORAL_TABLET | Freq: Every day | ORAL | 3 refills | Status: DC

## 2023-05-20 NOTE — Assessment & Plan Note (Signed)
Healthy---but eating too much Discussed fitness Still prefers no PSA---or vaccines Colon due again in 5 years

## 2023-05-20 NOTE — Assessment & Plan Note (Signed)
BP Readings from Last 3 Encounters:  05/20/23 136/84  12/29/22 104/71  05/14/22 108/74   Controlled on the maxzide 25 Will check labs

## 2023-05-20 NOTE — Progress Notes (Signed)
Subjective:    Patient ID: Anthony Bates, male    DOB: Aug 12, 1960, 62 y.o.   MRN: [id number]  HPI Here for physical  Doing fine Has 2+ years till retirement (at least)  Weight continues to drift back up----working 12 hour days (but 4 day weeks now) Not really exercising Does snack late at night  Current Outpatient Medications on File Prior to Visit  Medication Sig Dispense Refill   ibuprofen (ADVIL,MOTRIN) 200 MG tablet Take 400 mg by mouth every 6 (six) hours as needed for headache or moderate pain.     triamterene-hydrochlorothiazide (MAXZIDE-25) 37.5-25 MG tablet TAKE 1 TABLET BY MOUTH EVERY DAY 90 tablet 1   No current facility-administered medications on file prior to visit.    No Known Allergies  Past Medical History:  Diagnosis Date   Allergy    Diverticulitis    actually sounds like diverticulosis   Fatty liver 12/2011   by [place]   Hx of colonic polyps     hx of-adenomatous   Hyperlipidemia    Hypertension    Sleep apnea    uses cpap    Past Surgical History:  Procedure Laterality Date   APPENDECTOMY  1972   CERVICAL DISCECTOMY  2001   COLONOSCOPY N/A 06/07/2013   Procedure: COLONOSCOPY;  Surgeon: Louis Meckel, MD;  Location: [facility];  Service: Endoscopy;  Laterality: N/A;   COLONOSCOPY     pyloric stenosis  1960   ROTATOR CUFF REPAIR Left 2021   twice    Family History  Problem Relation Age of Onset   Rectal cancer Father    Colon cancer Father 78   Colon polyps Sister    Cancer - Other Sister        thyrhoid cancer    Hypertension Paternal Grandfather    Esophageal cancer Neg Hx    Stomach cancer Neg Hx     Social History   Socioeconomic History   Marital status: Married    Spouse name: Not on file   Number of children: 2   Years of education: Not on file   Highest education level: Not on file  Occupational History   Occupation: Games developer    Employer: [employer]  Tobacco Use   Smoking status: Former    Current  packs/day: 0.00    Types: Cigarettes    Quit date: 06/01/2005    Years since quitting: 17.9   Smokeless tobacco: Former  Building services engineer status: Never Used  Substance and Sexual Activity   Alcohol use: No    Comment: rare   Drug use: No   Sexual activity: Not on file  Other Topics Concern   Not on file  Social History Narrative   Not on file   Social Drivers of Health   Financial Resource Strain: Not on file  Food Insecurity: Not on file  Transportation Needs: Not on file  Physical Activity: Not on file  Stress: No Stress Concern Present (03/28/2020)   Received from [facility], [facility]   [employer] of Occupational Health - Occupational Stress Questionnaire    Feeling of Stress : Not at all  Social Connections: Unknown (10/14/2021)   Received from [facility], [facility]   Social Network    Social Network: Not on file  Intimate Partner Violence: Unknown (09/05/2021)   Received from [facility], [facility]   HITS    Physically Hurt: Not on file    Insult or Talk Down To: Not  on file    Threaten Physical Harm: Not on file    Scream or Curse: Not on file   Review of Systems  Constitutional:  Positive for unexpected weight change. Negative for fatigue.       Wears seat belt  HENT:  Negative for dental problem, hearing loss, tinnitus and trouble swallowing.        Overdue for dentist  Eyes:  Negative for visual disturbance.       No diplopia or unilateral vision loss  Respiratory:  Negative for cough, chest tightness and shortness of breath.   Cardiovascular:  Negative for chest pain and palpitations.       Ankles swell--better with elevation  Gastrointestinal:  Negative for blood in stool and constipation.       Rare heartburn--rolaids will help  Endocrine: Negative for polydipsia and polyuria.  Genitourinary:  Negative for difficulty urinating and urgency.       No sexual problems  Musculoskeletal:  Negative for arthralgias, back pain and  joint swelling.  Skin:  Negative for rash.  Allergic/Immunologic: Positive for environmental allergies. Negative for immunocompromised state.       Uses OTC antihistamines prn  Neurological:  Negative for dizziness, syncope, light-headedness and headaches.  Hematological:  Negative for adenopathy. Does not bruise/bleed easily.  Psychiatric/Behavioral:  Negative for dysphoric mood and sleep disturbance. The patient is not nervous/anxious.        Objective:   Physical Exam Constitutional:      Appearance: Normal appearance.  HENT:     Mouth/Throat:     Pharynx: No oropharyngeal exudate or posterior oropharyngeal erythema.  Eyes:     Conjunctiva/sclera: Conjunctivae normal.     Pupils: Pupils are equal, round, and reactive to light.  Cardiovascular:     Rate and Rhythm: Normal rate and regular rhythm.     Pulses: Normal pulses.     Heart sounds: No murmur heard.    No gallop.  Pulmonary:     Effort: Pulmonary effort is normal.     Breath sounds: Normal breath sounds. No wheezing or rales.  Abdominal:     Palpations: Abdomen is soft.     Tenderness: There is no abdominal tenderness.  Musculoskeletal:     Cervical back: Neck supple.     Right lower leg: No edema.     Left lower leg: No edema.  Lymphadenopathy:     Cervical: No cervical adenopathy.  Skin:    Findings: No lesion or rash.  Neurological:     General: No focal deficit present.     Mental Status: He is alert and oriented to person, place, and time.  Psychiatric:        Mood and Affect: Mood normal.        Behavior: Behavior normal.            Assessment & Plan:

## 2023-05-21 LAB — COMPREHENSIVE METABOLIC PANEL
ALT: 35 U/L (ref 0–53)
AST: 25 U/L (ref 0–37)
Albumin: 4.3 g/dL (ref 3.5–5.2)
Alkaline Phosphatase: 45 U/L (ref 39–117)
BUN: 15 mg/dL (ref 6–23)
CO2: 30 meq/L (ref 19–32)
Calcium: 8.9 mg/dL (ref 8.4–10.5)
Chloride: 103 meq/L (ref 96–112)
Creatinine, Ser: 0.97 mg/dL (ref 0.40–1.50)
GFR: 82.56 mL/min (ref >60.00)
Glucose, Bld: 81 mg/dL (ref 70–99)
Potassium: 4.1 meq/L (ref 3.5–5.1)
Sodium: 141 meq/L (ref 135–145)
Total Bilirubin: 0.5 mg/dL (ref 0.2–1.2)
Total Protein: 7.6 g/dL (ref 6.0–8.3)

## 2023-05-21 LAB — CBC
HCT: 44.3 % (ref 39.0–52.0)
Hemoglobin: 15.1 g/dL (ref 13.0–17.0)
MCHC: 34 g/dL (ref 30.0–36.0)
MCV: 95.4 fL (ref 78.0–100.0)
Platelets: 200 10*3/uL (ref 150.0–400.0)
RBC: 4.65 Mil/uL (ref 4.22–5.81)
RDW: 13.6 % (ref 11.5–15.5)
WBC: 8.1 10*3/uL (ref 4.0–10.5)

## 2024-06-07 ENCOUNTER — Encounter: Payer: Self-pay | Admitting: Nurse Practitioner

## 2024-06-07 ENCOUNTER — Encounter: Payer: Self-pay | Admitting: Emergency Medicine

## 2024-06-07 ENCOUNTER — Ambulatory Visit: Payer: Commercial Managed Care - PPO | Admitting: Nurse Practitioner

## 2024-06-07 VITALS — BP 126/80 | HR 80 | Temp 97.9°F | Ht 72.5 in | Wt 271.2 lb

## 2024-06-07 DIAGNOSIS — D171 Benign lipomatous neoplasm of skin and subcutaneous tissue of trunk: Secondary | ICD-10-CM | POA: Diagnosis not present

## 2024-06-07 DIAGNOSIS — Z6836 Body mass index (BMI) 36.0-36.9, adult: Secondary | ICD-10-CM | POA: Diagnosis not present

## 2024-06-07 DIAGNOSIS — Z122 Encounter for screening for malignant neoplasm of respiratory organs: Secondary | ICD-10-CM

## 2024-06-07 DIAGNOSIS — I10 Essential (primary) hypertension: Secondary | ICD-10-CM

## 2024-06-07 DIAGNOSIS — Z126 Encounter for screening for malignant neoplasm of bladder: Secondary | ICD-10-CM | POA: Diagnosis not present

## 2024-06-07 DIAGNOSIS — Z0000 Encounter for general adult medical examination without abnormal findings: Secondary | ICD-10-CM

## 2024-06-07 LAB — LIPID PANEL
Cholesterol: 183 mg/dL (ref 28–200)
HDL: 47.6 mg/dL
LDL Cholesterol: 111 mg/dL — ABNORMAL HIGH (ref 10–99)
NonHDL: 135.06
Total CHOL/HDL Ratio: 4
Triglycerides: 119 mg/dL (ref 10.0–149.0)
VLDL: 23.8 mg/dL (ref 0.0–40.0)

## 2024-06-07 LAB — COMPREHENSIVE METABOLIC PANEL WITH GFR
ALT: 33 U/L (ref 3–53)
AST: 25 U/L (ref 5–37)
Albumin: 4.4 g/dL (ref 3.5–5.2)
Alkaline Phosphatase: 44 U/L (ref 39–117)
BUN: 19 mg/dL (ref 6–23)
CO2: 32 meq/L (ref 19–32)
Calcium: 9.2 mg/dL (ref 8.4–10.5)
Chloride: 104 meq/L (ref 96–112)
Creatinine, Ser: 1.14 mg/dL (ref 0.40–1.50)
GFR: 67.51 mL/min
Glucose, Bld: 96 mg/dL (ref 70–99)
Potassium: 4.6 meq/L (ref 3.5–5.1)
Sodium: 141 meq/L (ref 135–145)
Total Bilirubin: 0.4 mg/dL (ref 0.2–1.2)
Total Protein: 7.4 g/dL (ref 6.0–8.3)

## 2024-06-07 LAB — CBC WITH DIFFERENTIAL/PLATELET
Basophils Absolute: 0.1 K/uL (ref 0.0–0.1)
Basophils Relative: 0.7 % (ref 0.0–3.0)
Eosinophils Absolute: 0.2 K/uL (ref 0.0–0.7)
Eosinophils Relative: 2.5 % (ref 0.0–5.0)
HCT: 46 % (ref 39.0–52.0)
Hemoglobin: 15.4 g/dL (ref 13.0–17.0)
Lymphocytes Relative: 24.3 % (ref 12.0–46.0)
Lymphs Abs: 1.9 K/uL (ref 0.7–4.0)
MCHC: 33.5 g/dL (ref 30.0–36.0)
MCV: 94.5 fl (ref 78.0–100.0)
Monocytes Absolute: 0.8 K/uL (ref 0.1–1.0)
Monocytes Relative: 10.8 % (ref 3.0–12.0)
Neutro Abs: 4.8 K/uL (ref 1.4–7.7)
Neutrophils Relative %: 61.7 % (ref 43.0–77.0)
Platelets: 200 K/uL (ref 150.0–400.0)
RBC: 4.87 Mil/uL (ref 4.22–5.81)
RDW: 14.3 % (ref 11.5–15.5)
WBC: 7.7 K/uL (ref 4.0–10.5)

## 2024-06-07 LAB — URINALYSIS, MICROSCOPIC ONLY

## 2024-06-07 LAB — TSH: TSH: 2.98 u[IU]/mL (ref 0.35–5.50)

## 2024-06-07 LAB — HEMOGLOBIN A1C: Hgb A1c MFr Bld: 5.7 % (ref 4.6–6.5)

## 2024-06-07 MED ORDER — TRIAMTERENE-HCTZ 37.5-25 MG PO TABS: 1.0000 | ORAL_TABLET | Freq: Every day | ORAL | 3 refills | Status: AC

## 2024-06-07 NOTE — Patient Instructions (Signed)
 Nice to see you today I will be in touch with the labs once I have them Follow up with me in 1 year, sooner if you need me

## 2024-06-07 NOTE — Assessment & Plan Note (Signed)
 Patient currently maintained on Maxide 37-1/2-25 mg daily.  Blood pressure controlled.  Pending labs refill sent to pharmacy

## 2024-06-07 NOTE — Progress Notes (Signed)
 "  Established Patient Office Visit  Subjective   Patient ID: Anthony Bates, male    DOB: June 04, 1960  Age: 64 y.o. MRN: [id number]  Chief Complaint  Patient presents with   Annual Exam    HPI  HTN: Patient currently maintained on triamterene  37.5-25 mg daily. Tolerates well and np bp checking at home   for complete physical and follow up of chronic conditions.  Immunizations: -Tetanus: Completed in 2022 -Influenza: Declined -Shingles declined -Pneumonia declined  Diet: Fair diet. He is eating 1 meal a day and sometime he will snack. Water and coffee Exercise: No regular exercise. Working and walks a lot with work   Eye exam: PRN readers Dental exam: Needs up daily     Colonoscopy: Completed in 12/29/2022, repeat 5 years.  Patient due 2029 Lung Cancer Screening: Former smoker.  Smoked for 1-1.5 ppd for 40 years  PSA: Deferred last year?  Does not want this year  Sleep: goes to bed around 10 and will get up around 445a. Feels rested sometimes. He does snore. He does have a CPAP that he does not use.       Review of Systems  Constitutional:  Negative for chills and fever.  Respiratory:  Negative for shortness of breath.   Cardiovascular:  Negative for chest pain and leg swelling.  Gastrointestinal:  Negative for abdominal pain, blood in stool, constipation, diarrhea, nausea and vomiting.       BM daily   Genitourinary:  Negative for dysuria and hematuria.       Nocturia x 1  Neurological:  Positive for headaches. Negative for dizziness and tingling.  Psychiatric/Behavioral:  Negative for hallucinations and suicidal ideas.       Objective:     BP 126/80   Pulse 80   Temp 97.9 F (36.6 C) (Oral)   Ht 6' 0.5 (1.842 m)   Wt 271 lb 3.2 oz (123 kg)   SpO2 95%   BMI 36.28 kg/m  BP Readings from Last 3 Encounters:  06/07/24 126/80  05/20/23 136/84  12/29/22 104/71   Wt Readings from Last 3 Encounters:  06/07/24 271 lb 3.2 oz (123 kg)  05/20/23 297 lb  (134.7 kg)  12/29/22 280 lb (127 kg)   SpO2 Readings from Last 3 Encounters:  06/07/24 95%  05/20/23 96%  12/29/22 96%      Physical Exam Vitals and nursing note reviewed.  Constitutional:      Appearance: Normal appearance.  HENT:     Right Ear: Tympanic membrane, ear canal and external ear normal.     Left Ear: Tympanic membrane, ear canal and external ear normal.     Mouth/Throat:     Mouth: Mucous membranes are moist.     Pharynx: Oropharynx is clear.  Eyes:     Extraocular Movements: Extraocular movements intact.     Pupils: Pupils are equal, round, and reactive to light.  Cardiovascular:     Rate and Rhythm: Normal rate and regular rhythm.     Pulses: Normal pulses.     Heart sounds: Normal heart sounds.  Pulmonary:     Effort: Pulmonary effort is normal.     Breath sounds: Normal breath sounds.  Abdominal:     General: Bowel sounds are normal. There is no distension.     Palpations: There is no mass.     Tenderness: There is no abdominal tenderness.     Hernia: No hernia is present.  Musculoskeletal:     Right lower leg:  No edema.     Left lower leg: No edema.  Lymphadenopathy:     Cervical: No cervical adenopathy.  Skin:    General: Skin is warm.     Findings: Lesion present.      Neurological:     General: No focal deficit present.     Mental Status: He is alert.     Deep Tendon Reflexes:     Reflex Scores:      Bicep reflexes are 2+ on the right side and 2+ on the left side.      Patellar reflexes are 2+ on the right side and 2+ on the left side.    Comments: Bilateral upper and lower extremity strength 5/5  Psychiatric:        Mood and Affect: Mood normal.        Behavior: Behavior normal.        Thought Content: Thought content normal.        Judgment: Judgment normal.      No results found for any visits on 06/07/24.    The ASCVD Risk score (Arnett DK, et al., 2019) failed to calculate for the following reasons:   Cannot find a previous  HDL lab   Cannot find a previous total cholesterol lab   * - Cholesterol units were assumed    Assessment & Plan:   Problem List Items Addressed This Visit       Cardiovascular and Mediastinum   Hypertension   Patient currently maintained on Maxide 37-1/2-25 mg daily.  Blood pressure controlled.  Pending labs refill sent to pharmacy      Relevant Medications   triamterene -hydrochlorothiazide  (MAXZIDE-25) 37.5-25 MG tablet   Other Relevant Orders   CBC with Differential/Platelet   Comprehensive metabolic panel with GFR   Hemoglobin A1c   Lipid panel     Other   Preventative health care - Primary   Discussed age-appropriate immunizations and screening exams.  Did review patient's personal, surgical, social, family histories.  Patient is up-to-date on all age-appropriate vaccinations he would like.  Patient declined flu, pneumonia, shingles vaccine.  Patient is up-to-date on CRC screening.  Patient declined PSA screening today.  Patient was given information at discharge about preventative healthcare maintenance and anticipatory guidance.      Relevant Orders   CBC with Differential/Platelet   Comprehensive metabolic panel with GFR   TSH   Lipoma of torso   Longstanding stable lipoma.  No signs and symptoms of infection.  Mobile nontender      Morbid obesity (HCC)   BMI of 35+ with hypertension.  Patient has lost approximate 26 pounds since last year this is with intention.  Continue working healthy lifestyle modifications.  Pending A1c and lipid panel      Other Visit Diagnoses       Screening for lung cancer       Relevant Orders   Ambulatory Referral Lung Cancer Screening [facility]     Screening for bladder cancer       Relevant Orders   Urine Microscopic       Return in about 1 year (around 06/07/2025) for CPE and Labs.    Adina Crandall, NP  "

## 2024-06-07 NOTE — Assessment & Plan Note (Signed)
 Discussed age-appropriate immunizations and screening exams.  Did review patient's personal, surgical, social, family histories.  Patient is up-to-date on all age-appropriate vaccinations he would like.  Patient declined flu, pneumonia, shingles vaccine.  Patient is up-to-date on CRC screening.  Patient declined PSA screening today.  Patient was given information at discharge about preventative healthcare maintenance and anticipatory guidance.

## 2024-06-07 NOTE — Assessment & Plan Note (Signed)
 Longstanding stable lipoma.  No signs and symptoms of infection.  Mobile nontender

## 2024-06-07 NOTE — Assessment & Plan Note (Signed)
 BMI of 35+ with hypertension.  Patient has lost approximate 26 pounds since last year this is with intention.  Continue working healthy lifestyle modifications.  Pending A1c and lipid panel

## 2024-06-12 ENCOUNTER — Ambulatory Visit: Payer: Self-pay | Admitting: Nurse Practitioner

## 2024-06-12 DIAGNOSIS — R7303 Prediabetes: Secondary | ICD-10-CM | POA: Insufficient documentation

## 2025-06-13 ENCOUNTER — Encounter: Admitting: Nurse Practitioner
